# Patient Record
Sex: Male | Born: 1955 | Race: White | Hispanic: No | Marital: Married | Smoking: Never smoker
Health system: Southern US, Community
[De-identification: ages and names within clinical notes are randomized; demographics above are authoritative.]

## PROBLEM LIST (undated history)

## (undated) DIAGNOSIS — J45909 Unspecified asthma, uncomplicated: Secondary | ICD-10-CM

## (undated) DIAGNOSIS — E119 Type 2 diabetes mellitus without complications: Secondary | ICD-10-CM

## (undated) DIAGNOSIS — E291 Testicular hypofunction: Secondary | ICD-10-CM

## (undated) DIAGNOSIS — R7989 Other specified abnormal findings of blood chemistry: Secondary | ICD-10-CM

## (undated) DIAGNOSIS — I10 Essential (primary) hypertension: Secondary | ICD-10-CM

## (undated) DIAGNOSIS — E669 Obesity, unspecified: Secondary | ICD-10-CM

## (undated) DIAGNOSIS — J310 Chronic rhinitis: Secondary | ICD-10-CM

## (undated) DIAGNOSIS — K760 Fatty (change of) liver, not elsewhere classified: Secondary | ICD-10-CM

## (undated) DIAGNOSIS — K635 Polyp of colon: Secondary | ICD-10-CM

## (undated) DIAGNOSIS — R945 Abnormal results of liver function studies: Secondary | ICD-10-CM

## (undated) HISTORY — DX: Unspecified asthma, uncomplicated: J45.909

## (undated) HISTORY — DX: Testicular hypofunction: E29.1

## (undated) HISTORY — DX: Chronic rhinitis: J31.0

## (undated) HISTORY — DX: Type 2 diabetes mellitus without complications: E11.9

## (undated) HISTORY — DX: Obesity, unspecified: E66.9

## (undated) HISTORY — DX: Essential (primary) hypertension: I10

## (undated) HISTORY — DX: Abnormal results of liver function studies: R94.5

## (undated) HISTORY — PX: CARDIAC CATHETERIZATION: SHX172

## (undated) HISTORY — DX: Fatty (change of) liver, not elsewhere classified: K76.0

## (undated) HISTORY — DX: Other specified abnormal findings of blood chemistry: R79.89

## (undated) HISTORY — PX: CORONARY ANGIOPLASTY: SHX604

## (undated) HISTORY — DX: Polyp of colon: K63.5

## (undated) HISTORY — PX: UMBILICAL HERNIA REPAIR: SHX196

## (undated) HISTORY — PX: CAROTID STENT: SHX1301

---

## 2000-01-02 ENCOUNTER — Encounter: Admission: RE | Admit: 2000-01-02 | Discharge: 2000-01-02 | Payer: Self-pay | Admitting: *Deleted

## 2000-01-02 ENCOUNTER — Encounter: Payer: Self-pay | Admitting: *Deleted

## 2000-05-19 ENCOUNTER — Encounter: Admission: RE | Admit: 2000-05-19 | Discharge: 2000-05-19 | Payer: Self-pay | Admitting: *Deleted

## 2000-05-19 ENCOUNTER — Encounter: Payer: Self-pay | Admitting: *Deleted

## 2000-12-21 ENCOUNTER — Ambulatory Visit (HOSPITAL_COMMUNITY): Admission: RE | Admit: 2000-12-21 | Discharge: 2000-12-21 | Payer: Self-pay | Admitting: Gastroenterology

## 2002-12-14 ENCOUNTER — Encounter: Admission: RE | Admit: 2002-12-14 | Discharge: 2003-03-14 | Payer: Self-pay | Admitting: Internal Medicine

## 2010-11-04 ENCOUNTER — Inpatient Hospital Stay (HOSPITAL_COMMUNITY)
Admission: AD | Admit: 2010-11-04 | Discharge: 2010-11-06 | Payer: Self-pay | Source: Home / Self Care | Attending: Cardiology | Admitting: Cardiology

## 2010-11-06 LAB — CBC
HCT: 43.4 % (ref 39.0–52.0)
HCT: 41 % (ref 39.0–52.0)
Hemoglobin: 14.5 g/dL (ref 13.0–17.0)
Hemoglobin: 13.7 g/dL (ref 13.0–17.0)
MCH: 29.6 pg (ref 26.0–34.0)
MCH: 29.8 pg (ref 26.0–34.0)
MCHC: 33.4 g/dL (ref 30.0–36.0)
MCHC: 33.4 g/dL (ref 30.0–36.0)
MCV: 88.6 fL (ref 78.0–100.0)
MCV: 89.3 fL (ref 78.0–100.0)
Platelets: 237 10*3/uL (ref 150–400)
Platelets: 217 10*3/uL (ref 150–400)
RBC: 4.9 MIL/uL (ref 4.22–5.81)
RBC: 4.59 MIL/uL (ref 4.22–5.81)
RDW: 13.1 % (ref 11.5–15.5)
RDW: 13.2 % (ref 11.5–15.5)
WBC: 7.5 10*3/uL (ref 4.0–10.5)
WBC: 7.2 10*3/uL (ref 4.0–10.5)

## 2010-11-06 LAB — LIPID PANEL
Cholesterol: 185 mg/dL (ref 0–200)
HDL: 37 mg/dL — ABNORMAL LOW (ref >39)
LDL Cholesterol: 125 mg/dL — ABNORMAL HIGH (ref 0–99)
Total CHOL/HDL Ratio: 5 RATIO
Triglycerides: 115 mg/dL (ref <150)
VLDL: 23 mg/dL (ref 0–40)

## 2010-11-06 LAB — GLUCOSE, CAPILLARY
Glucose-Capillary: 169 mg/dL — ABNORMAL HIGH (ref 70–99)
Glucose-Capillary: 216 mg/dL — ABNORMAL HIGH (ref 70–99)
Glucose-Capillary: 183 mg/dL — ABNORMAL HIGH (ref 70–99)
Glucose-Capillary: 220 mg/dL — ABNORMAL HIGH (ref 70–99)
Glucose-Capillary: 252 mg/dL — ABNORMAL HIGH (ref 70–99)
Glucose-Capillary: 187 mg/dL — ABNORMAL HIGH (ref 70–99)
Glucose-Capillary: 129 mg/dL — ABNORMAL HIGH (ref 70–99)

## 2010-11-06 LAB — CARDIAC PANEL(CRET KIN+CKTOT+MB+TROPI)
CK, MB: 2.2 ng/mL (ref 0.3–4.0)
CK, MB: 1.8 ng/mL (ref 0.3–4.0)
CK, MB: 1.6 ng/mL (ref 0.3–4.0)
Relative Index: 1.2 (ref 0.0–2.5)
Relative Index: 1.1 (ref 0.0–2.5)
Relative Index: 1.1 (ref 0.0–2.5)
Total CK: 184 U/L (ref 7–232)
Total CK: 157 U/L (ref 7–232)
Total CK: 142 U/L (ref 7–232)
Troponin I: <0.01 ng/mL (ref 0.00–0.06)
Troponin I: 0.01 ng/mL (ref 0.00–0.06)
Troponin I: 0.01 ng/mL (ref 0.00–0.06)

## 2010-11-06 LAB — BASIC METABOLIC PANEL
BUN: 13 mg/dL (ref 6–23)
CO2: 21 mEq/L (ref 19–32)
Calcium: 9.3 mg/dL (ref 8.4–10.5)
Chloride: 109 mEq/L (ref 96–112)
Creatinine, Ser: 1.27 mg/dL (ref 0.4–1.5)
GFR calc Af Amer: >60 mL/min (ref >60)
GFR calc non Af Amer: 59 mL/min — ABNORMAL LOW (ref >60)
Glucose, Bld: 180 mg/dL — ABNORMAL HIGH (ref 70–99)
Potassium: 4.8 mEq/L (ref 3.5–5.1)
Sodium: 143 mEq/L (ref 135–145)

## 2010-11-06 LAB — HEMOGLOBIN A1C
Hgb A1c MFr Bld: 8.3 % — ABNORMAL HIGH (ref <5.7)
Mean Plasma Glucose: 192 mg/dL — ABNORMAL HIGH (ref <117)

## 2010-11-06 LAB — HEPARIN LEVEL (UNFRACTIONATED): Heparin Unfractionated: <0.1 IU/mL — ABNORMAL LOW (ref 0.30–0.70)

## 2010-11-06 LAB — PROTIME-INR
INR: 0.93 (ref 0.00–1.49)
Prothrombin Time: 12.7 seconds (ref 11.6–15.2)

## 2010-11-06 LAB — APTT: aPTT: 25 seconds (ref 24–37)

## 2010-11-06 LAB — TSH: TSH: 1.956 u[IU]/mL (ref 0.350–4.500)

## 2010-11-08 NOTE — Discharge Summary (Signed)
[name], [place]STON NO.:  [id number]  MEDICAL RECORD NO.:  [id number]          PATIENT TYPE:  INP  LOCATION:  6524                         FACILITY:  [facility]  PHYSICIAN:  [name], MD      DATE OF BIRTH:  [birth year]  DATE OF ADMISSION:  11/04/2010 DATE OF DISCHARGE:                              DISCHARGE SUMMARY   PRIMARY CARE PHYSICIAN:  Georgann Housekeeper, MD.  CARDIOLOGIST:  Veverly Fells. Anne Fu, MD.  FINAL DIAGNOSES: 1. Unstable angina - successful stent placement to obtuse marginal     PROMUS 3.0 x 20-mm drug-eluting stent.  Residual small caliber     vessel coronary artery disease in the ramus branch of up to 80% as     well as distal RCA of up to 80%.  Normal left ventricular ejection     fraction by LV gram.  Procedure was cardiac catheterization via the     right radial artery approach. 2. Diabetes.  Hemoglobin A1c 8.3.  Goal was 7, managed by Dr. Georgann Housekeeper on both Amaryl as well as metformin. 3. Hyperlipidemia - total cholesterol 185, HDL 37, LDL 125 with a goal     of less than 70, triglycerides of 115.  He has had multiple statin     intolerances in the past.  According to his records, Crestor,     WelChol, and Zocor all have caused muscle aches.  He is currently     on Pravachol, which he seems to be tolerating.  We will work on     this as an outpatient. 4. Obesity - BMI is 39 - encourage diet and weight loss exercise.     This will also help with his diabetes. 5. Mild fatty liver disease. 6. Hypertension - well controlled throughout hospitalization. 7. Microalbuminemia,  urea amine.  BRIEF HOSPITAL COURSE:  A 55 year old male who was seen in the clinic setting on day of admission for clinical history of unstable angina with chest tightness with exertion, pressure when chopping wood or when exerting himself that was relieved with rest.  He had deeply inverted T- waves in the lateral leads and he was admitted to the hospital for further  observation.  Cardiac biomarkers were all normal.  He is placed on IV heparin.  The next morning, he was cathed via the right radial artery approach, which demonstrated disease as above.  Successful stent placement.  He tolerated the procedure well.  Radial artery is patent.  PHYSICAL EXAMINATION:  VITAL SIGNS:  Blood pressure 134/76, satting 97% on room air, pulse is 75, temperature 97.7, and respiration rate 16. HEART:  Regular rate and rhythm. LUNGS:  Clear.  No JVD.  No significant edema.  DISCHARGE MEDICATIONS:  New medication is clopidogrel or Plavix 75 mg once a day as well as higher dose aspirin 325 mg once a day.  He is also on nitroglycerin sublingual 0.4 mg p.r.n. chest pain.  Other medications are the same and as follows, 1. Atenolol 50 mg once a day. 2. Glimepiride or Amaryl 2 mg twice a day. 3. Metformin XR 500 mg 2 tablets twice  a day. 4. Pravachol or pravastatin 20 mg once a day. 5. Ramipril 2.5 mg once a day (microalbuminuria).  DISCHARGE INSTRUCTIONS:  Followup is on Wednesday, November 13, 2010, at 2:30 p.m. with Dr. Anne Fu.  I also encouraged him to follow up with his primary care physician, Dr. Donette Larry for further intensive management of his diabetes.  Cardiac rehab has also seen him in consultation.  He was ambulating well on day of discharge, feeling well, and quite appreciative.     [name], MD     MCS/MEDQ  D:  11/06/2010  T:  11/06/2010  Job:  [id number]  cc:   Georgann Housekeeper, MD  Electronically Signed by Donato Schultz MD on 11/08/2010 07:04:30 AM

## 2010-11-08 NOTE — Procedures (Signed)
NAMEMERWIN, BREDEN NO.:  [id number]  MEDICAL RECORD NO.:  [id number]          PATIENT TYPE:  INP  LOCATION:  2027                         FACILITY:  [facility]  PHYSICIAN:  [name], MD      DATE OF BIRTH:  1956-10-05  DATE OF PROCEDURE: DATE OF DISCHARGE:                           CARDIAC CATHETERIZATION   PROCEDURE:  Left heart catheterization, selective coronary angiography, and left ventriculogram via the right radial artery approach.  INDICATIONS:  A 55 year old male with diabetes, hypertension, hyperlipidemia, and obesity who over the past few months has been experiencing accelerating angina and burning in his mid chest wall with activity that goes away with rest and presented to the [facility] yesterday with an EKG that showed deeply inverted T-waves in V4, V5, and V6 as well as T-wave inversions in I, AVL, and biphasic T-wave in V3 consistent with ischemia.  He was admitted from my office after consultation and here for cardiac catheterization.  Informed consent was obtained.  Risk of stroke, heart attack, death, renal impairment, and bleeding have been explained to the patient.  Creatinine on morning of procedure is 1.2.  PROCEDURE DETAILS:  Lidocaine 1% was used for local anesthesia. Fentanyl and Versed were used for conscious sedation.  A 5-French hydrophilic sheath was inserted without difficulty into the right radial artery.  A wooly wire was used to traverse a Judkins right #4 catheter around the arch of the aorta into the right coronary artery.  After the arch was traversed, 4000 units of heparin was administered, 3 mg of verapamil was also administered.  A Judkins right #4 catheter was used to selectively cannulate the right coronary artery and multiple views with hand injection of Omnipaque were obtained.  This right coronary artery also easily went into the left ventricle and left ventriculogram in the RAO  position utilizing hand injection was performed.  This catheter was then exchanged for a Judkins left 3.5 catheter, which selectively cannulated the left main artery.  Multiple views with hand injection of Omnipaque were obtained.  Following the procedure, the 5- French hydrophilic sheath was taken out and a 6-French sheath was inserted in its place.  Verapamil 2 mg was administered as well as 200 mcg of nitroglycerin into the sheath during sheath exchange.  The patient was quite comfortable, feeling well.  FINDINGS: 1. Left main artery - widely patent, gives rise to the LAD, small     ramus branch, and circumflex artery. 2. LAD artery - minor luminal irregularities throughout the LAD with     no significant flow-limiting disease present.  There is one main     long diagonal branch originating at the branch point of the septal     branch as well.  No ostial disease. 3. Small ramus branch - there is a mid lesion of approximately 70-80%,     however, this vessel was quite small in character. 4. Circumflex artery - this vessel gives rise to two obtuse marginal     branches.  The first obtuse marginal branch just after the branch     point from the AV groove circ demonstrates  approximately 15 mm in     length tubular stenosis of 90%.  The vessel then branches just     after this stenosis.  Minor luminal irregularities throughout     otherwise. 5. Right coronary artery - this vessel is a dominant vessel giving     rise to the posterior descending artery, and there is a small 80%     lesion in the distal RCA, which is quite a small vessel, almost the     same size of that small ramus branch previously described.  Left ventriculogram:  Left ventricular ejection fraction appears normal with no wall motion abnormalities present in the RAO position.  This was done via hand injection and post PVC beat is best visualized.  An echocardiogram can be performed later.  IMPRESSION: 1. An 80% obtuse  marginal disease consistent with ischemia on EKG.     There is also a small ramus branch, possibly a 2-mm vessel with mid     80% stenosis as well as a distal right coronary artery small 2-mm     branch of 70-80%. 2. Normal-appearing left ventricular ejection fraction with no     evidence of any aortic stenosis or mitral regurgitation.     Hemodynamics are as follows:  Left ventricle 126 with an end-     diastolic pressure of 18 mmHg.  Aortic pressure 126/78 with a mean     of 100 mmHg.  PLAN:  Findings have been discussed with the patient as well as Dr. Eldridge Dace, and we will focus on the obtuse marginal lesion in this large vessel, which is correlating with ischemia seen on EKG.  At this point, no intervention on right coronary artery or small ramus branch given their small-caliber vessel nature.     [name], MD     MCS/MEDQ  D:  11/05/2010  T:  11/05/2010  Job:  [id number]  cc:   Georgann Housekeeper, MD  Electronically Signed by Donato Schultz MD on 11/08/2010 07:04:20 AM

## 2010-11-08 NOTE — H&P (Signed)
[name], [name] NO.:  [id number]  MEDICAL RECORD NO.:  [id number]          PATIENT TYPE:  INP  LOCATION:  2027                         FACILITY:  [facility]  PHYSICIAN:  [name], MD      DATE OF BIRTH:  Feb 18, 1956  DATE OF ADMISSION:  11/04/2010 DATE OF DISCHARGE:                             HISTORY & PHYSICAL   PRIMARY CARE PHYSICIAN:  Georgann Housekeeper, MD  REASON FOR ADMISSION:  Unstable angina.  HISTORY OF PRESENT ILLNESS:  A 55 year old male with diabetes, hypertension, hyperlipidemia, obesity who has been experiencing increasing chest tightness/burning with exertion that is relieved with rest, moderate severity.  He states that when going up stairs or chopping wood, he experiences this discomfort and when he sits down it goes away.  He thought at first that it was related to his recent upper respiratory infection that he had approximately a month ago but it has not subsided.  An EKG was performed downstairs at his initial encounter with [facility], which demonstrated sinus bradycardia rate 54 with biphasic T- wave in lead V3 and deeply inverted T-waves in V4, V5, V6 as well as one in AVL.  He also had an inverted T-wave in lead II and aVF.  This was a marked change from his prior EKG in 2001 which was normal.  When asked if he had any discomfort today, he did state that point right before lunch when moving around, he did experience some burning which was relieved with rest.  He had also lost approximately 20 pounds earlier last year with exercise and he had not been experiencing these symptoms.  PAST MEDICAL HISTORY:  Hypertension, dyslipidemia, diabetes, mild fatty liver disease with mildly elevated LFT workup in 2001, obesity, colonic polyp in March 2008, testosterone replacement, chronic kidney disease stage I, with microalbumin.  ALLERGIES/INTOLERANCES:  [employer], and ZOCOR; all causes muscle aches/GI upset.  SURGICAL  HISTORY:  Umbilical hernia repair in 1997.  FAMILY HISTORY:  Father deceased in his early 46s from heart disease. He also had colon cancer and diabetes.  Mother is alive and well at age 45.  He has four brothers and two sisters.  SOCIAL HISTORY:  Never smoked.  No drinking.  No alcohol.  Does drink caffeine.  No recreational drug use.  He works with [employer].  He is one of the manager's.  He is married with two children.  REVIEW OF SYSTEMS:  Denies any bleeding problems, melena, syncope, orthopnea.  Unless specified above, all other 12 review of systems negative.  PHYSICAL EXAM:  VITAL SIGNS:  Weight 262 pounds, BMI 39.8, pulse 64, blood pressure 152/96. GENERAL:  Alert and oriented x3 in no acute distress, ruddy skin appearance. EYES:  Well-perfused conjunctivae.  EOMI.  No scleral icterus. NECK:  Thick, supple.  No lymphadenopathy.  No thyromegaly.  No carotid bruits.  No JVD. CARDIOVASCULAR:  Regular rate and rhythm/bradycardic, regular rhythm with no appreciable murmurs, rubs, or gallops.  Normal PMI. LUNGS:  Clear to auscultation bilaterally.  Normal respiratory effort. ABDOMEN:  Soft, nontender.  Normoactive bowel sounds.  Obese.  Positive bruits. EXTREMITIES:  No clubbing, cyanosis, or edema.  Normal distal pulses, 2+ radial pulses bilaterally. GU:  Deferred. RECTAL:  Deferred. NEUROLOGIC:  Nonfocal.  No tremors are noted. SKIN:  Warm, dry, and intact, ruddy skin appearance.  LABS:  Full complement of lab work including cardiac biomarkers are currently pending.  Most recent creatinine in August showed was 1.08. Most recent hemoglobin A1c was 7.4.  EKG as described above.  ASSESSMENT/PLAN:  A 55 year old male with diabetes, hypertension, hyperlipidemia, obesity with unstable angina. 1. Unstable angina - had chest discomfort earlier today, burning     relieved with rest that has been increasing in frequency and     severity and is accompanied with  an EKG highly concerning for     ischemia given his deep T-wave inversions in the precordial leads     as described above.  I did not feel comfortable letting him go home     and we will be admitting him to the hospital to telemetry and will     place him on heparin, continue beta-blocker, atenolol, and     Pravachol and ramipril for blood pressure.  We will go ahead and     set him up for cardiac catheterization tomorrow morning to further     illustrate his vessels.  We reviewed the risks and benefits of the     procedure including stroke, heart attack, death, renal impairment.     He understands the possibility of possible percutaneous     intervention and/or bypass surgery.  We will also continue with     full complement of blood work including cardiac markers, INR, CBC,     TSH, and a 12-lead in the morning as well as fasting lipids in the     morning.  I will hold off on Plavix for now given the possibility of bypass surgery.  We will promptly initiate heparin IV.  1. Diabetes - we will place on insulin sliding scale.  We will hold     his metformin as well as Amaryl. 2. Hyperlipidemia - we will continue with Pravachol for now, likely     goal LDL less than 70.  He has had intolerances to Crestor,     WelChol, and Zocor. 3. Obesity - encourage weight loss.     [name], MD     MCS/MEDQ  D:  11/04/2010  T:  11/05/2010  Job:  [id number]  cc:   Georgann Housekeeper, MD  Electronically Signed by Donato Schultz MD on 11/08/2010 07:04:04 AM

## 2010-11-11 LAB — BASIC METABOLIC PANEL
BUN: 11 mg/dL (ref 6–23)
CO2: 26 mEq/L (ref 19–32)
Calcium: 9.2 mg/dL (ref 8.4–10.5)
Chloride: 105 mEq/L (ref 96–112)
Creatinine, Ser: 1.2 mg/dL (ref 0.4–1.5)
GFR calc Af Amer: >60 mL/min (ref >60)
GFR calc non Af Amer: >60 mL/min (ref >60)
Glucose, Bld: 189 mg/dL — ABNORMAL HIGH (ref 70–99)
Potassium: 4.6 mEq/L (ref 3.5–5.1)
Sodium: 141 mEq/L (ref 135–145)

## 2010-11-11 LAB — GLUCOSE, CAPILLARY: Glucose-Capillary: 183 mg/dL — ABNORMAL HIGH (ref 70–99)

## 2010-11-11 LAB — CBC
HCT: 42.4 % (ref 39.0–52.0)
Hemoglobin: 14 g/dL (ref 13.0–17.0)
MCH: 29.5 pg (ref 26.0–34.0)
MCHC: 33 g/dL (ref 30.0–36.0)
MCV: 89.3 fL (ref 78.0–100.0)
Platelets: 201 10*3/uL (ref 150–400)
RBC: 4.75 MIL/uL (ref 4.22–5.81)
RDW: 13.2 % (ref 11.5–15.5)
WBC: 8.2 10*3/uL (ref 4.0–10.5)

## 2010-11-11 LAB — HEPARIN LEVEL (UNFRACTIONATED): Heparin Unfractionated: <0.1 IU/mL — ABNORMAL LOW (ref 0.30–0.70)

## 2010-11-23 ENCOUNTER — Emergency Department (HOSPITAL_COMMUNITY): Payer: BC Managed Care – PPO

## 2010-11-23 ENCOUNTER — Emergency Department (HOSPITAL_COMMUNITY)
Admission: EM | Admit: 2010-11-23 | Discharge: 2010-11-23 | Disposition: A | Discharge status: Home / Self Care | Payer: BC Managed Care – PPO | Attending: Emergency Medicine | Admitting: Emergency Medicine

## 2010-11-23 DIAGNOSIS — I10 Essential (primary) hypertension: Secondary | ICD-10-CM | POA: Insufficient documentation

## 2010-11-23 DIAGNOSIS — I2510 Atherosclerotic heart disease of native coronary artery without angina pectoris: Secondary | ICD-10-CM | POA: Insufficient documentation

## 2010-11-23 DIAGNOSIS — L299 Pruritus, unspecified: Secondary | ICD-10-CM | POA: Insufficient documentation

## 2010-11-23 DIAGNOSIS — E119 Type 2 diabetes mellitus without complications: Secondary | ICD-10-CM | POA: Insufficient documentation

## 2010-11-23 DIAGNOSIS — M7989 Other specified soft tissue disorders: Secondary | ICD-10-CM | POA: Insufficient documentation

## 2010-11-23 LAB — COMPREHENSIVE METABOLIC PANEL
Albumin: 3.9 g/dL (ref 3.5–5.2)
Alkaline Phosphatase: 58 U/L (ref 39–117)
BUN: 13 mg/dL (ref 6–23)
CO2: 24 mEq/L (ref 19–32)
Chloride: 104 mEq/L (ref 96–112)
GFR calc non Af Amer: >60 mL/min (ref >60)
Glucose, Bld: 177 mg/dL — ABNORMAL HIGH (ref 70–99)
Potassium: 4.6 mEq/L (ref 3.5–5.1)
Total Bilirubin: 0.6 mg/dL (ref 0.3–1.2)

## 2010-11-23 LAB — CBC
Hemoglobin: 14.5 g/dL (ref 13.0–17.0)
MCH: 30.4 pg (ref 26.0–34.0)
MCHC: 34.4 g/dL (ref 30.0–36.0)
MCV: 88.3 fL (ref 78.0–100.0)
RBC: 4.77 MIL/uL (ref 4.22–5.81)

## 2010-11-23 LAB — DIFFERENTIAL
Eosinophils Absolute: 0.2 10*3/uL (ref 0.0–0.7)
Lymphs Abs: 1 10*3/uL (ref 0.7–4.0)
Monocytes Absolute: 0.4 10*3/uL (ref 0.1–1.0)
Monocytes Relative: 5 % (ref 3–12)
Neutro Abs: 6.6 10*3/uL (ref 1.7–7.7)
Neutrophils Relative %: 80 % — ABNORMAL HIGH (ref 43–77)

## 2010-11-23 LAB — SEDIMENTATION RATE: Sed Rate: 20 mm/hr — ABNORMAL HIGH (ref 0–16)

## 2010-11-23 LAB — URINALYSIS, ROUTINE W REFLEX MICROSCOPIC
Bilirubin Urine: NEGATIVE
Hgb urine dipstick: NEGATIVE
Ketones, ur: NEGATIVE mg/dL
Urine Glucose, Fasting: NEGATIVE mg/dL
pH: 7 (ref 5.0–8.0)

## 2010-11-25 ENCOUNTER — Ambulatory Visit (HOSPITAL_COMMUNITY): Payer: BC Managed Care – PPO

## 2010-11-27 ENCOUNTER — Encounter (HOSPITAL_COMMUNITY): Payer: BC Managed Care – PPO

## 2010-11-29 ENCOUNTER — Encounter (HOSPITAL_COMMUNITY): Payer: BC Managed Care – PPO

## 2010-12-02 ENCOUNTER — Encounter (HOSPITAL_COMMUNITY): Payer: BC Managed Care – PPO

## 2010-12-04 ENCOUNTER — Encounter (HOSPITAL_COMMUNITY): Payer: BC Managed Care – PPO

## 2010-12-04 NOTE — Procedures (Signed)
  [name], CRIST NO.:  [id number]  MEDICAL RECORD NO.:  [id number]          PATIENT TYPE:  INP  LOCATION:  2807                         FACILITY:  [facility]  PHYSICIAN:  Corky Crafts, MDDATE OF BIRTH:  03/05/1956  DATE OF PROCEDURE:  11/05/2010 DATE OF DISCHARGE:                           CARDIAC CATHETERIZATION   PRIMARY CARDIOLOGIST:  [name], MD  PRIMARY CARE PHYSICIAN:  Georgann Housekeeper, MD  PROCEDURES PERFORMED:  PCI of the first obtuse marginal.  OPERATOR:  Corky Crafts, MD  INDICATIONS:  Abnormal stress test and angina.  PROCEDURE:  The diagnostic catheterization was done by Dr. Anne Fu.  This revealed a 90% OM1 lesion.  Angiomax was given.  A 6-French radial sheath was used.  A 3.5 JL guide was used to engage the left main.  A Prowater wire was placed across the lesion.  A 2.5 x 15 apex balloon was inflated at 8 and 10 atmospheres for predilatation.  A 3.0 x 20 Promus element stent was then placed across the diseased area and deployed 14 atmospheres.  A 3.25 x 15 [place] Quantum apex balloon was inflated for 16 atmospheres and then again to 12 atmospheres.  There was an excellent angiographic result with no residual stenosis.  TIMI3 flow was maintained throughout.  IMPRESSION:  Successful PCI of the OM1 with drug-eluting stent.  RECOMMENDATIONS:  Continue aspirin and Plavix for at least a year.  The patient will be watched overnight.  Nitroglycerin was given into the coronary system to help with vasospasm.  It was also given through the radial sheath to help with radial artery vasospasm.     Corky Crafts, MD     JSV/MEDQ  D:  11/05/2010  T:  11/05/2010  Job:  [id number]  Electronically Signed by Lance Muss MD on 12/04/2010 09:35:29 AM

## 2010-12-06 ENCOUNTER — Encounter (HOSPITAL_COMMUNITY): Payer: BC Managed Care – PPO

## 2010-12-09 ENCOUNTER — Encounter (HOSPITAL_COMMUNITY): Payer: BC Managed Care – PPO

## 2010-12-11 ENCOUNTER — Encounter (HOSPITAL_COMMUNITY): Payer: BC Managed Care – PPO

## 2010-12-13 ENCOUNTER — Encounter (HOSPITAL_COMMUNITY): Payer: BC Managed Care – PPO

## 2010-12-16 ENCOUNTER — Other Ambulatory Visit: Payer: Self-pay

## 2010-12-16 ENCOUNTER — Encounter (HOSPITAL_COMMUNITY)
Admission: RE | Admit: 2010-12-16 | Discharge: 2010-12-16 | Disposition: A | Discharge status: Home / Self Care | Payer: BC Managed Care – PPO | Source: Ambulatory Visit | Attending: Cardiology | Admitting: Cardiology

## 2010-12-16 DIAGNOSIS — I200 Unstable angina: Secondary | ICD-10-CM | POA: Insufficient documentation

## 2010-12-16 DIAGNOSIS — E119 Type 2 diabetes mellitus without complications: Secondary | ICD-10-CM | POA: Insufficient documentation

## 2010-12-16 DIAGNOSIS — Z5189 Encounter for other specified aftercare: Secondary | ICD-10-CM | POA: Insufficient documentation

## 2010-12-16 DIAGNOSIS — I2510 Atherosclerotic heart disease of native coronary artery without angina pectoris: Secondary | ICD-10-CM | POA: Insufficient documentation

## 2010-12-16 DIAGNOSIS — Z9861 Coronary angioplasty status: Secondary | ICD-10-CM | POA: Insufficient documentation

## 2010-12-16 DIAGNOSIS — E785 Hyperlipidemia, unspecified: Secondary | ICD-10-CM | POA: Insufficient documentation

## 2010-12-16 DIAGNOSIS — E669 Obesity, unspecified: Secondary | ICD-10-CM | POA: Insufficient documentation

## 2010-12-16 DIAGNOSIS — I10 Essential (primary) hypertension: Secondary | ICD-10-CM | POA: Insufficient documentation

## 2010-12-16 DIAGNOSIS — K7689 Other specified diseases of liver: Secondary | ICD-10-CM | POA: Insufficient documentation

## 2010-12-16 LAB — GLUCOSE, CAPILLARY: Glucose-Capillary: 172 mg/dL — ABNORMAL HIGH (ref 70–99)

## 2010-12-18 ENCOUNTER — Other Ambulatory Visit: Payer: Self-pay

## 2010-12-18 ENCOUNTER — Encounter (HOSPITAL_COMMUNITY): Payer: BC Managed Care – PPO

## 2010-12-20 ENCOUNTER — Encounter (HOSPITAL_COMMUNITY): Payer: BC Managed Care – PPO

## 2010-12-23 ENCOUNTER — Encounter (HOSPITAL_COMMUNITY): Payer: BC Managed Care – PPO

## 2010-12-24 ENCOUNTER — Observation Stay (HOSPITAL_COMMUNITY)
Admission: RE | Admit: 2010-12-24 | Discharge: 2010-12-24 | Disposition: A | Discharge status: Home / Self Care | Payer: BC Managed Care – PPO | Source: Ambulatory Visit | Attending: Cardiology | Admitting: Cardiology

## 2010-12-24 DIAGNOSIS — E119 Type 2 diabetes mellitus without complications: Secondary | ICD-10-CM | POA: Insufficient documentation

## 2010-12-24 DIAGNOSIS — I2510 Atherosclerotic heart disease of native coronary artery without angina pectoris: Principal | ICD-10-CM | POA: Insufficient documentation

## 2010-12-24 DIAGNOSIS — I10 Essential (primary) hypertension: Secondary | ICD-10-CM | POA: Insufficient documentation

## 2010-12-24 DIAGNOSIS — R079 Chest pain, unspecified: Secondary | ICD-10-CM | POA: Insufficient documentation

## 2010-12-24 DIAGNOSIS — E785 Hyperlipidemia, unspecified: Secondary | ICD-10-CM | POA: Insufficient documentation

## 2010-12-24 DIAGNOSIS — R9431 Abnormal electrocardiogram [ECG] [EKG]: Secondary | ICD-10-CM | POA: Insufficient documentation

## 2010-12-24 DIAGNOSIS — Z9861 Coronary angioplasty status: Secondary | ICD-10-CM | POA: Insufficient documentation

## 2010-12-24 LAB — GLUCOSE, CAPILLARY: Glucose-Capillary: 154 mg/dL — ABNORMAL HIGH (ref 70–99)

## 2010-12-25 ENCOUNTER — Encounter (HOSPITAL_COMMUNITY): Payer: BC Managed Care – PPO

## 2010-12-27 ENCOUNTER — Encounter (HOSPITAL_COMMUNITY): Payer: BC Managed Care – PPO

## 2010-12-30 ENCOUNTER — Encounter (HOSPITAL_COMMUNITY): Payer: BC Managed Care – PPO | Attending: Cardiology

## 2010-12-30 DIAGNOSIS — K7689 Other specified diseases of liver: Secondary | ICD-10-CM | POA: Insufficient documentation

## 2010-12-30 DIAGNOSIS — Z9861 Coronary angioplasty status: Secondary | ICD-10-CM | POA: Insufficient documentation

## 2010-12-30 DIAGNOSIS — I200 Unstable angina: Secondary | ICD-10-CM | POA: Insufficient documentation

## 2010-12-30 DIAGNOSIS — E119 Type 2 diabetes mellitus without complications: Secondary | ICD-10-CM | POA: Insufficient documentation

## 2010-12-30 DIAGNOSIS — Z5189 Encounter for other specified aftercare: Secondary | ICD-10-CM | POA: Insufficient documentation

## 2010-12-30 DIAGNOSIS — E669 Obesity, unspecified: Secondary | ICD-10-CM | POA: Insufficient documentation

## 2010-12-30 DIAGNOSIS — E785 Hyperlipidemia, unspecified: Secondary | ICD-10-CM | POA: Insufficient documentation

## 2010-12-30 DIAGNOSIS — I10 Essential (primary) hypertension: Secondary | ICD-10-CM | POA: Insufficient documentation

## 2010-12-30 DIAGNOSIS — I2510 Atherosclerotic heart disease of native coronary artery without angina pectoris: Secondary | ICD-10-CM | POA: Insufficient documentation

## 2011-01-01 ENCOUNTER — Encounter (HOSPITAL_COMMUNITY): Payer: BC Managed Care – PPO

## 2011-01-03 ENCOUNTER — Encounter (HOSPITAL_COMMUNITY): Payer: BC Managed Care – PPO

## 2011-01-06 ENCOUNTER — Other Ambulatory Visit: Payer: Self-pay

## 2011-01-06 ENCOUNTER — Encounter (HOSPITAL_COMMUNITY): Payer: BC Managed Care – PPO

## 2011-01-08 ENCOUNTER — Encounter (HOSPITAL_COMMUNITY): Payer: BC Managed Care – PPO

## 2011-01-10 ENCOUNTER — Encounter (HOSPITAL_COMMUNITY): Payer: BC Managed Care – PPO

## 2011-01-13 ENCOUNTER — Encounter (HOSPITAL_COMMUNITY): Payer: BC Managed Care – PPO

## 2011-01-13 NOTE — Procedures (Signed)
NAMEROWYN, SPILDE NO.:  [id number]  MEDICAL RECORD NO.:  [id number]           PATIENT TYPE:  I  LOCATION:  6522                         FACILITY:  [facility]  PHYSICIAN:  [name], MD      DATE OF BIRTH:  06-Dec-1955  DATE OF PROCEDURE:  12/24/2010 DATE OF DISCHARGE:  12/24/2010                           CARDIAC CATHETERIZATION   INDICATIONS:  Mr. Ketchum is a 55 year old male, who recently underwent drug-eluting stent to the obtuse marginal branch on November 05, 2010, who during cardiac rehab during relaxation noticed increased ST-segment depression and deep T-wave inversion especially in the lateral leads on the monitor.  He states that he was not having any specific symptoms, but it feels more anxious and noticed occasional mild chest tightness, nonexertional.  He was taken to the cardiac catheterization lab to ensure that the deployment of the circumflex stent was normal.  PROCEDURE IN DETAILS:  Informed consent was obtained.  Risk of stroke, heart attack, death, renal impairment, arterial damage, bleeding were explained to the patient at length.  The right radial artery site was used, 1% lidocaine was used for local anesthesia.  Allen's test was done pre and post procedure and was normal.  A 5-French sheath was inserted in the right radial artery without difficulty utilizing the modified Seldinger technique and a Wholey wire was used to traverse around the aortic arch and a Judkins #4 right catheter and a Judkins #3.5 catheter were used to selectively cannulate the coronary arteries.  A 3 mg of verapamil was administered intrasheath after insertion and 4000 units of heparin was administered once the catheter traverse the aortic arch.  As of note around his aortic arch, deep breathing help to straighten out the arch to allow for improved wire/catheter positioning.  An angled pigtail was then used to cross the left ventricle and the  left ventriculogram utilizing 30 mL of contrast in the LAO position, was performed to better evaluate the anterolateral wall.  Following the procedure, catheters removed, sheath was removed, and Terumo T band was used for hemostasis.  He tolerated the procedure well without any difficulties.  FINDINGS: 1. Left main artery - widely patent, short, divides into the     circumflex artery as well as LAD.  There is a ramus branch present     as well.  No angiographically significant disease in the left main     artery. 2. Circumflex artery - the portion of the stent placement is widely     patent with minimal step-up just following the bifurcation of the     AV groove circ.  Excellent appointment and no evidence of stenosis.     The remainder of the vessel remains widely patent. 3. Ramus branch - this small caliber ramus branch which is     approximately a 1.5-2.0 vessel, does have residual 80%-90% stenosis     in its mid proximal segment.  Certainly, less in caliber size than     the 5-French catheter being utilized for angiography. 4. Left anterior descending artery.  This vessel is widely patent     giving rise to 2 diagonal  branches.  No angiographically     significant disease.  Left ventriculogram performed in the LAO     position shows normal left ventricular ejection fraction with no     evidence of any wall motion abnormality in the lateral projection.  HEMODYNAMICS:  Left ventricular systolic pressure 131 with an end- diastolic pressure of 12.  Aortic pressure 135/76 with a mean of 100 mmHg.  There is no evidence of mitral regurgitation or aortic stenosis present.  IMPRESSION: 1. Widely patent recently placed drug-eluting stent in the left     circumflex distribution. 2. 80%-90% stenosis in a small-caliber ramus branch, which was seen on     previous angiography. 3. Normal left ventricular ejection fraction with no evidence of wall     motion abnormality. 4. No evidence of  mitral regurgitation or aortic stenosis.  PLAN:  After review of the angiogram, we will continue to medically manage the 80%-90% stenosis in the small ramus branch.  Given his lack of symptoms currently, I did not feel that this warrants percutaneous intervention.  Medications reviewed.  We will continue with current therapy which includes isosorbide.  He does note that he has been feeling quite well in cardiac rehab.  We will see him back in clinic in 1 week.     [name], MD     MCS/MEDQ  D:  12/24/2010  T:  12/25/2010  Job:  [id number]  Electronically Signed by Donato Schultz MD on 01/13/2011 02:26:55 PM

## 2011-01-15 ENCOUNTER — Encounter (HOSPITAL_COMMUNITY): Payer: BC Managed Care – PPO

## 2011-01-17 ENCOUNTER — Encounter (HOSPITAL_COMMUNITY): Payer: BC Managed Care – PPO

## 2011-01-20 ENCOUNTER — Encounter (HOSPITAL_COMMUNITY): Payer: BC Managed Care – PPO | Attending: Cardiology

## 2011-01-20 DIAGNOSIS — I2510 Atherosclerotic heart disease of native coronary artery without angina pectoris: Secondary | ICD-10-CM | POA: Insufficient documentation

## 2011-01-20 DIAGNOSIS — E119 Type 2 diabetes mellitus without complications: Secondary | ICD-10-CM | POA: Insufficient documentation

## 2011-01-20 DIAGNOSIS — I10 Essential (primary) hypertension: Secondary | ICD-10-CM | POA: Insufficient documentation

## 2011-01-20 DIAGNOSIS — K7689 Other specified diseases of liver: Secondary | ICD-10-CM | POA: Insufficient documentation

## 2011-01-20 DIAGNOSIS — E785 Hyperlipidemia, unspecified: Secondary | ICD-10-CM | POA: Insufficient documentation

## 2011-01-20 DIAGNOSIS — I200 Unstable angina: Secondary | ICD-10-CM | POA: Insufficient documentation

## 2011-01-20 DIAGNOSIS — Z9861 Coronary angioplasty status: Secondary | ICD-10-CM | POA: Insufficient documentation

## 2011-01-20 DIAGNOSIS — E669 Obesity, unspecified: Secondary | ICD-10-CM | POA: Insufficient documentation

## 2011-01-20 DIAGNOSIS — Z5189 Encounter for other specified aftercare: Secondary | ICD-10-CM | POA: Insufficient documentation

## 2011-01-22 ENCOUNTER — Encounter (HOSPITAL_COMMUNITY): Payer: BC Managed Care – PPO

## 2011-01-24 ENCOUNTER — Encounter (HOSPITAL_COMMUNITY): Payer: BC Managed Care – PPO

## 2011-01-27 ENCOUNTER — Encounter (HOSPITAL_COMMUNITY): Payer: BC Managed Care – PPO

## 2011-01-29 ENCOUNTER — Encounter (HOSPITAL_COMMUNITY): Payer: BC Managed Care – PPO

## 2011-01-31 ENCOUNTER — Encounter (HOSPITAL_COMMUNITY): Payer: BC Managed Care – PPO

## 2011-02-03 ENCOUNTER — Encounter (HOSPITAL_COMMUNITY): Payer: BC Managed Care – PPO

## 2011-02-05 ENCOUNTER — Encounter (HOSPITAL_COMMUNITY): Payer: BC Managed Care – PPO

## 2011-02-07 ENCOUNTER — Encounter (HOSPITAL_COMMUNITY): Payer: BC Managed Care – PPO

## 2011-02-07 ENCOUNTER — Other Ambulatory Visit: Payer: Self-pay | Admitting: Cardiology

## 2011-02-07 LAB — GLUCOSE, CAPILLARY: Glucose-Capillary: 175 mg/dL — ABNORMAL HIGH (ref 70–99)

## 2011-02-10 ENCOUNTER — Encounter (HOSPITAL_COMMUNITY): Payer: BC Managed Care – PPO

## 2011-02-12 ENCOUNTER — Encounter (HOSPITAL_COMMUNITY): Payer: BC Managed Care – PPO

## 2011-02-14 ENCOUNTER — Encounter (HOSPITAL_COMMUNITY): Payer: BC Managed Care – PPO

## 2011-02-17 ENCOUNTER — Encounter (HOSPITAL_COMMUNITY): Payer: BC Managed Care – PPO

## 2011-02-19 ENCOUNTER — Encounter (HOSPITAL_COMMUNITY): Payer: BC Managed Care – PPO | Attending: Cardiology

## 2011-02-19 DIAGNOSIS — E669 Obesity, unspecified: Secondary | ICD-10-CM | POA: Insufficient documentation

## 2011-02-19 DIAGNOSIS — Z5189 Encounter for other specified aftercare: Secondary | ICD-10-CM | POA: Insufficient documentation

## 2011-02-19 DIAGNOSIS — I2510 Atherosclerotic heart disease of native coronary artery without angina pectoris: Secondary | ICD-10-CM | POA: Insufficient documentation

## 2011-02-19 DIAGNOSIS — I200 Unstable angina: Secondary | ICD-10-CM | POA: Insufficient documentation

## 2011-02-19 DIAGNOSIS — K7689 Other specified diseases of liver: Secondary | ICD-10-CM | POA: Insufficient documentation

## 2011-02-19 DIAGNOSIS — Z9861 Coronary angioplasty status: Secondary | ICD-10-CM | POA: Insufficient documentation

## 2011-02-19 DIAGNOSIS — E119 Type 2 diabetes mellitus without complications: Secondary | ICD-10-CM | POA: Insufficient documentation

## 2011-02-19 DIAGNOSIS — E785 Hyperlipidemia, unspecified: Secondary | ICD-10-CM | POA: Insufficient documentation

## 2011-02-19 DIAGNOSIS — I10 Essential (primary) hypertension: Secondary | ICD-10-CM | POA: Insufficient documentation

## 2011-02-21 ENCOUNTER — Encounter (HOSPITAL_COMMUNITY): Payer: BC Managed Care – PPO

## 2011-02-24 ENCOUNTER — Encounter (HOSPITAL_COMMUNITY): Payer: BC Managed Care – PPO

## 2011-02-26 ENCOUNTER — Encounter (HOSPITAL_COMMUNITY): Payer: BC Managed Care – PPO

## 2011-02-28 ENCOUNTER — Encounter (HOSPITAL_COMMUNITY): Payer: BC Managed Care – PPO

## 2011-02-28 ENCOUNTER — Other Ambulatory Visit: Payer: Self-pay | Admitting: Cardiology

## 2011-02-28 LAB — GLUCOSE, CAPILLARY: Glucose-Capillary: 163 mg/dL — ABNORMAL HIGH (ref 70–99)

## 2011-03-03 ENCOUNTER — Encounter (HOSPITAL_COMMUNITY): Payer: BC Managed Care – PPO

## 2011-03-05 ENCOUNTER — Encounter (HOSPITAL_COMMUNITY): Payer: BC Managed Care – PPO

## 2011-03-06 ENCOUNTER — Emergency Department (HOSPITAL_COMMUNITY): Payer: BC Managed Care – PPO

## 2011-03-06 ENCOUNTER — Emergency Department (HOSPITAL_COMMUNITY)
Admission: EM | Admit: 2011-03-06 | Discharge: 2011-03-06 | Disposition: A | Discharge status: Home / Self Care | Payer: BC Managed Care – PPO | Attending: Emergency Medicine | Admitting: Emergency Medicine

## 2011-03-06 DIAGNOSIS — I10 Essential (primary) hypertension: Secondary | ICD-10-CM | POA: Insufficient documentation

## 2011-03-06 DIAGNOSIS — R079 Chest pain, unspecified: Secondary | ICD-10-CM | POA: Insufficient documentation

## 2011-03-06 DIAGNOSIS — E7800 Pure hypercholesterolemia, unspecified: Secondary | ICD-10-CM | POA: Insufficient documentation

## 2011-03-06 DIAGNOSIS — E119 Type 2 diabetes mellitus without complications: Secondary | ICD-10-CM | POA: Insufficient documentation

## 2011-03-06 DIAGNOSIS — Z79899 Other long term (current) drug therapy: Secondary | ICD-10-CM | POA: Insufficient documentation

## 2011-03-06 DIAGNOSIS — Z7982 Long term (current) use of aspirin: Secondary | ICD-10-CM | POA: Insufficient documentation

## 2011-03-06 DIAGNOSIS — R1012 Left upper quadrant pain: Secondary | ICD-10-CM | POA: Insufficient documentation

## 2011-03-06 DIAGNOSIS — I701 Atherosclerosis of renal artery: Secondary | ICD-10-CM | POA: Insufficient documentation

## 2011-03-06 LAB — DIFFERENTIAL
Basophils Absolute: 0.1 10*3/uL (ref 0.0–0.1)
Eosinophils Relative: 8 % — ABNORMAL HIGH (ref 0–5)
Lymphocytes Relative: 32 % (ref 12–46)
Monocytes Absolute: 0.6 10*3/uL (ref 0.1–1.0)

## 2011-03-06 LAB — POCT CARDIAC MARKERS: CKMB, poc: <1 ng/mL — ABNORMAL LOW (ref 1.0–8.0)

## 2011-03-06 LAB — CBC
HCT: 40.5 % (ref 39.0–52.0)
MCHC: 34.1 g/dL (ref 30.0–36.0)
MCV: 89.4 fL (ref 78.0–100.0)
RDW: 13.9 % (ref 11.5–15.5)

## 2011-03-06 LAB — BASIC METABOLIC PANEL
BUN: 12 mg/dL (ref 6–23)
Calcium: 9.2 mg/dL (ref 8.4–10.5)
GFR calc non Af Amer: >60 mL/min (ref >60)
Glucose, Bld: 171 mg/dL — ABNORMAL HIGH (ref 70–99)
Sodium: 138 mEq/L (ref 135–145)

## 2011-03-07 ENCOUNTER — Encounter (HOSPITAL_COMMUNITY): Payer: BC Managed Care – PPO

## 2011-03-07 NOTE — Procedures (Signed)
[place]  Patient:    [name], [name]                  MRN: [id number] Proc. Date: 12/21/00 Attending:  Verlin Grills, M.D. CC:         Yvette Rack. Dorna Bloom, M.D.   Procedure Report  DATE OF BIRTH:  May 03, 1956  REFERRING PHYSICIAN:  PROCEDURE PERFORMED:  Colonoscopy.  ENDOSCOPIST:  Verlin Grills, M.D.  INDICATIONS FOR PROCEDURE:  The patient is a 55 year old male referred for surveillance colonoscopy and possible polypectomy to prevent colon cancer. Mr. Heckmann father was diagnosed with colon cancer at age 6.  I discussed with the patient the complications associated with colonoscopy and polypectomy including intestinal bleeding and intestinal perforation.  The patient has signed the operative permit.  PREMEDICATION:  Fentanyl 100 mcg.  Versed 10 mg.  ENDOSCOPE:  Olympus pediatric. colonoscope.  DESCRIPTION OF PROCEDURE:  After obtaining informed consent, the patient was placed in the left lateral decubitus position.  I administered intravenous fentanyl and intravenous Versed to achieve sedation for the procedure.  The patients blood pressure, oxygen saturation and cardiac rhythm were monitored throughout the procedure and documented in the medical record.  Anal inspection was normal.  Digital rectal exam was normal.  The Olympus pediatric video colonoscope was then introduced into the rectum and under direct vision, advanced to the cecum as identified by a normal-appearing ileocecal valve.  The ileocecal valve was intubated and the distal ileum inspected.  Colonic preparation for the exam today was excellent.  Rectum:  Normal.  Sigmoid colon and descending colon:  Normal.  Splenic flexure:  Normal.  Transverse colon:  Normal.  Hepatic flexure:  Normal.  Ascending colon:  Normal.  Cecum and ileocecal valve:  Normal.  Distal ileum:  Normal.  ASSESSMENT:  Normal proctocolonoscopy to the cecum with  intubation of the ileocecal valve and distal ileum inspection.  No endoscopic evidence for the presence of colorectal neoplasia.  RECOMMENDATIONS:  Repeat colonoscopy in 5 to 10 years. DD:  12/21/00 TD:  12/21/00 Job: [id number]

## 2011-03-10 ENCOUNTER — Encounter (HOSPITAL_COMMUNITY): Payer: BC Managed Care – PPO

## 2011-03-12 ENCOUNTER — Encounter (HOSPITAL_COMMUNITY): Payer: BC Managed Care – PPO

## 2011-03-14 ENCOUNTER — Encounter (HOSPITAL_COMMUNITY): Payer: BC Managed Care – PPO

## 2011-03-14 NOTE — Discharge Summary (Signed)
  NAMEJAHVON, [name] NO.:  [id number]  MEDICAL RECORD NO.:  [id number]           PATIENT TYPE:  I  LOCATION:  6522                         FACILITY:  [facility]  PHYSICIAN:  [name], MD      DATE OF BIRTH:  18-Sep-1956  DATE OF ADMISSION:  12/24/2010 DATE OF DISCHARGE:  12/24/2010                              DISCHARGE SUMMARY   PROCEDURE:  Cardiac catheterization.  FINAL DIAGNOSES: 1. Coronary artery disease. 2. Chest pain. 3. Status post drug-eluting stent to obtuse marginal branch November 05, 2010. 4. Abnormal EKG with deep T-wave inversion especially in the lateral     leads.  BRIEF HOSPITAL COURSE:  A 55 year old male who was taken to the Cardiac Catheterization Lab after a stent procedure was done on November 05, 2010, after he was experiencing some chest tightness and deep T-wave inversions on EKG.  Cardiac catheterization was performed and obtuse marginal stent was widely patent and there were no other abnormalities present.  There was an 80% to 90% stenosis in the small caliber ramus branch, which was seen on previous angiography.  Ejection fraction was normal.  He was discharged that day after cardiac catheterization via the right radial artery approach.  Discharge medications were not changed.  He is currently taking isosorbide.  Please see med reconciliation for full details.  He was stable on discharge.  He has followup in 1 week.  Labs were reviewed.     [name], MD     MCS/MEDQ  D:  03/12/2011  T:  03/12/2011  Job:  [id number]  Electronically Signed by Donato Schultz MD on 03/14/2011 06:16:10 AM

## 2011-03-17 ENCOUNTER — Encounter (HOSPITAL_COMMUNITY): Payer: BC Managed Care – PPO

## 2011-03-19 ENCOUNTER — Encounter (HOSPITAL_COMMUNITY): Payer: BC Managed Care – PPO

## 2011-03-21 ENCOUNTER — Encounter (HOSPITAL_COMMUNITY): Payer: BC Managed Care – PPO | Attending: Cardiology

## 2011-03-21 DIAGNOSIS — E785 Hyperlipidemia, unspecified: Secondary | ICD-10-CM | POA: Insufficient documentation

## 2011-03-21 DIAGNOSIS — Z9861 Coronary angioplasty status: Secondary | ICD-10-CM | POA: Insufficient documentation

## 2011-03-21 DIAGNOSIS — I200 Unstable angina: Secondary | ICD-10-CM | POA: Insufficient documentation

## 2011-03-21 DIAGNOSIS — Z5189 Encounter for other specified aftercare: Secondary | ICD-10-CM | POA: Insufficient documentation

## 2011-03-21 DIAGNOSIS — I2510 Atherosclerotic heart disease of native coronary artery without angina pectoris: Secondary | ICD-10-CM | POA: Insufficient documentation

## 2011-03-21 DIAGNOSIS — E119 Type 2 diabetes mellitus without complications: Secondary | ICD-10-CM | POA: Insufficient documentation

## 2011-03-21 DIAGNOSIS — K7689 Other specified diseases of liver: Secondary | ICD-10-CM | POA: Insufficient documentation

## 2011-03-21 DIAGNOSIS — E669 Obesity, unspecified: Secondary | ICD-10-CM | POA: Insufficient documentation

## 2011-03-21 DIAGNOSIS — I10 Essential (primary) hypertension: Secondary | ICD-10-CM | POA: Insufficient documentation

## 2011-03-24 ENCOUNTER — Encounter (HOSPITAL_COMMUNITY): Payer: BC Managed Care – PPO

## 2011-03-26 ENCOUNTER — Encounter (HOSPITAL_COMMUNITY): Payer: BC Managed Care – PPO

## 2011-03-28 ENCOUNTER — Encounter (HOSPITAL_COMMUNITY): Payer: BC Managed Care – PPO

## 2011-03-31 ENCOUNTER — Encounter (HOSPITAL_COMMUNITY): Payer: BC Managed Care – PPO

## 2011-04-02 ENCOUNTER — Encounter (HOSPITAL_COMMUNITY): Payer: BC Managed Care – PPO

## 2011-04-04 ENCOUNTER — Encounter (HOSPITAL_COMMUNITY): Payer: BC Managed Care – PPO

## 2011-06-07 IMAGING — CR DG CHEST 2V
2 series · 2 of 2 positions shown · non-contrast
Comparison: [DATE] for

CLINICAL DATA: Chest pain and short of breath

CHEST - 2 VIEW

[w chest pa]
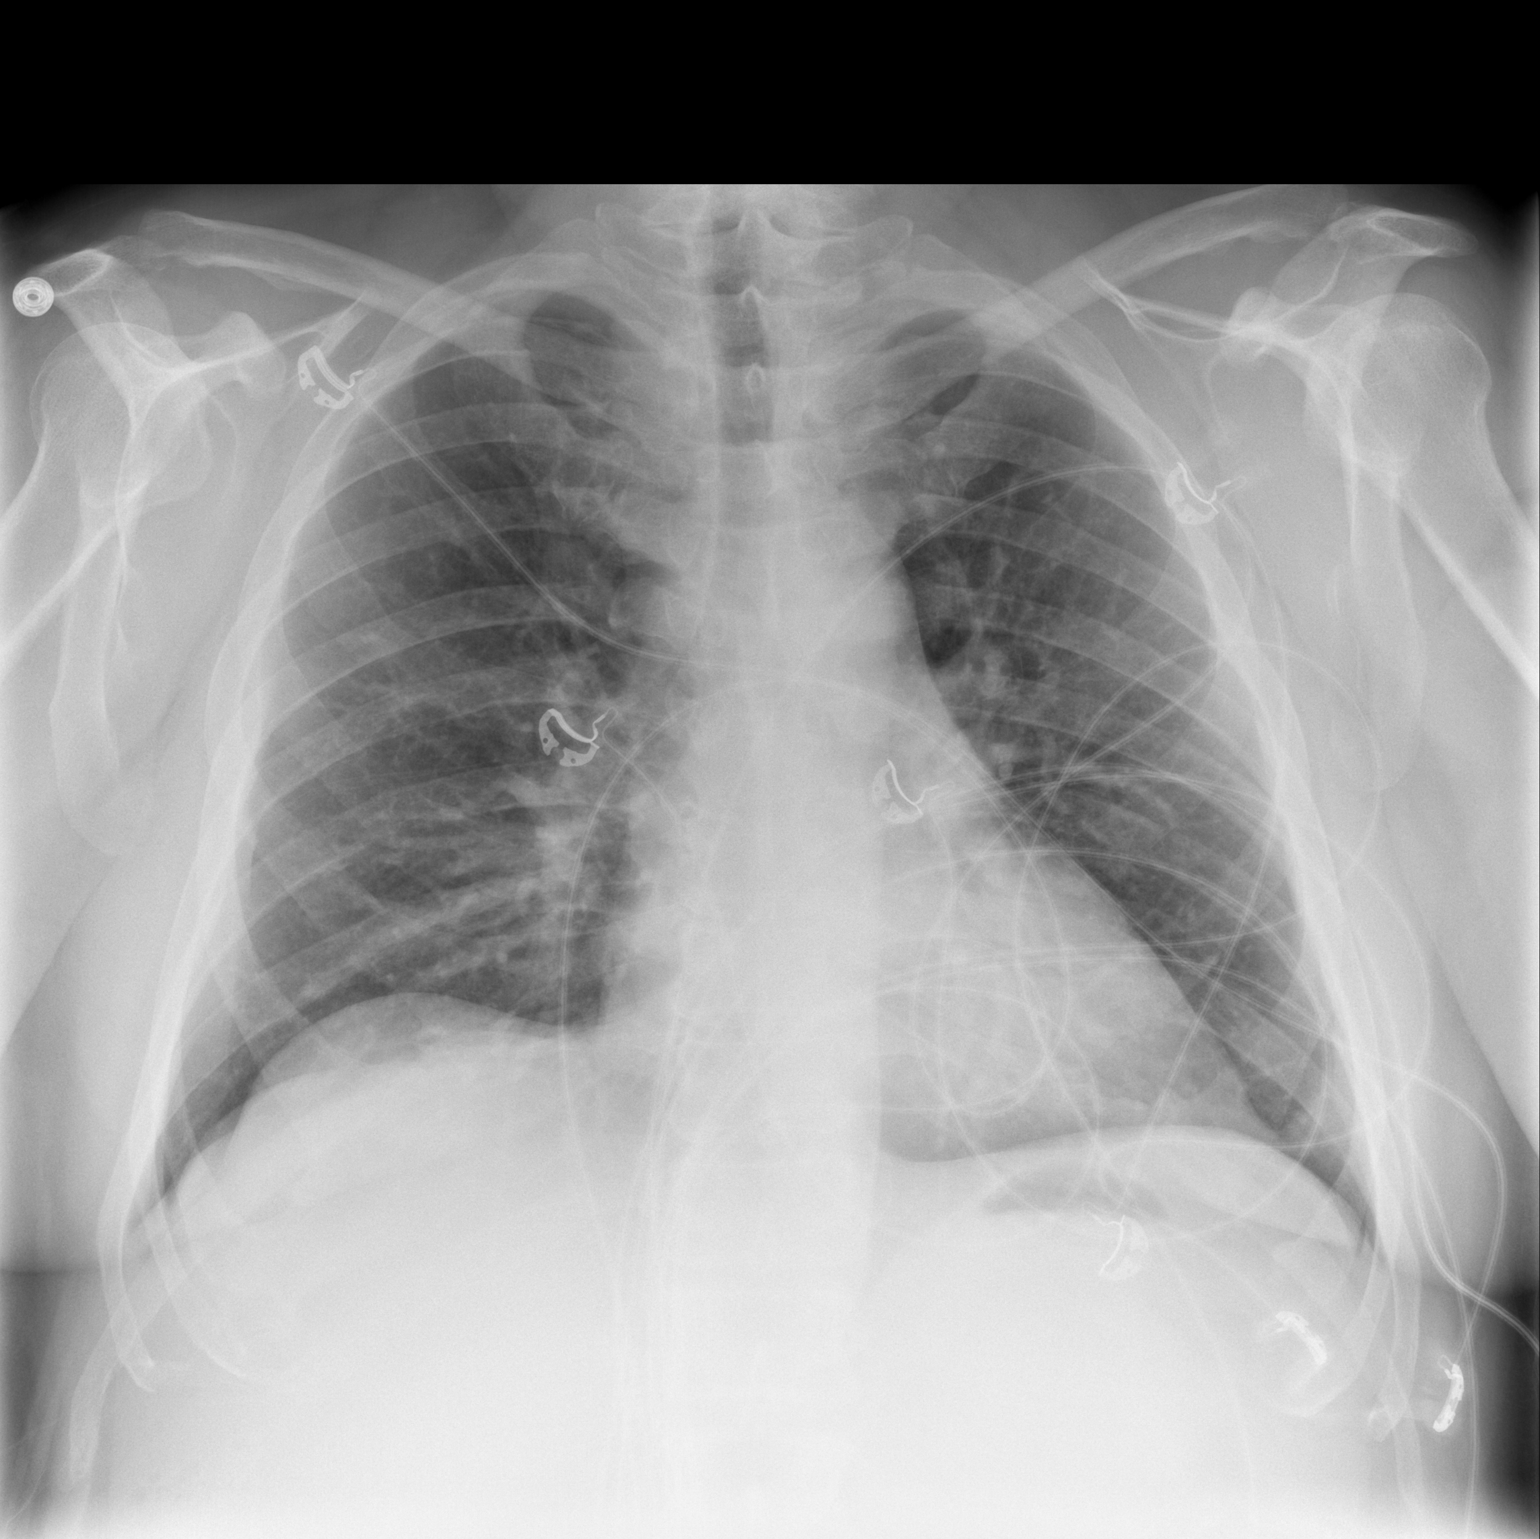

[w chest lat]
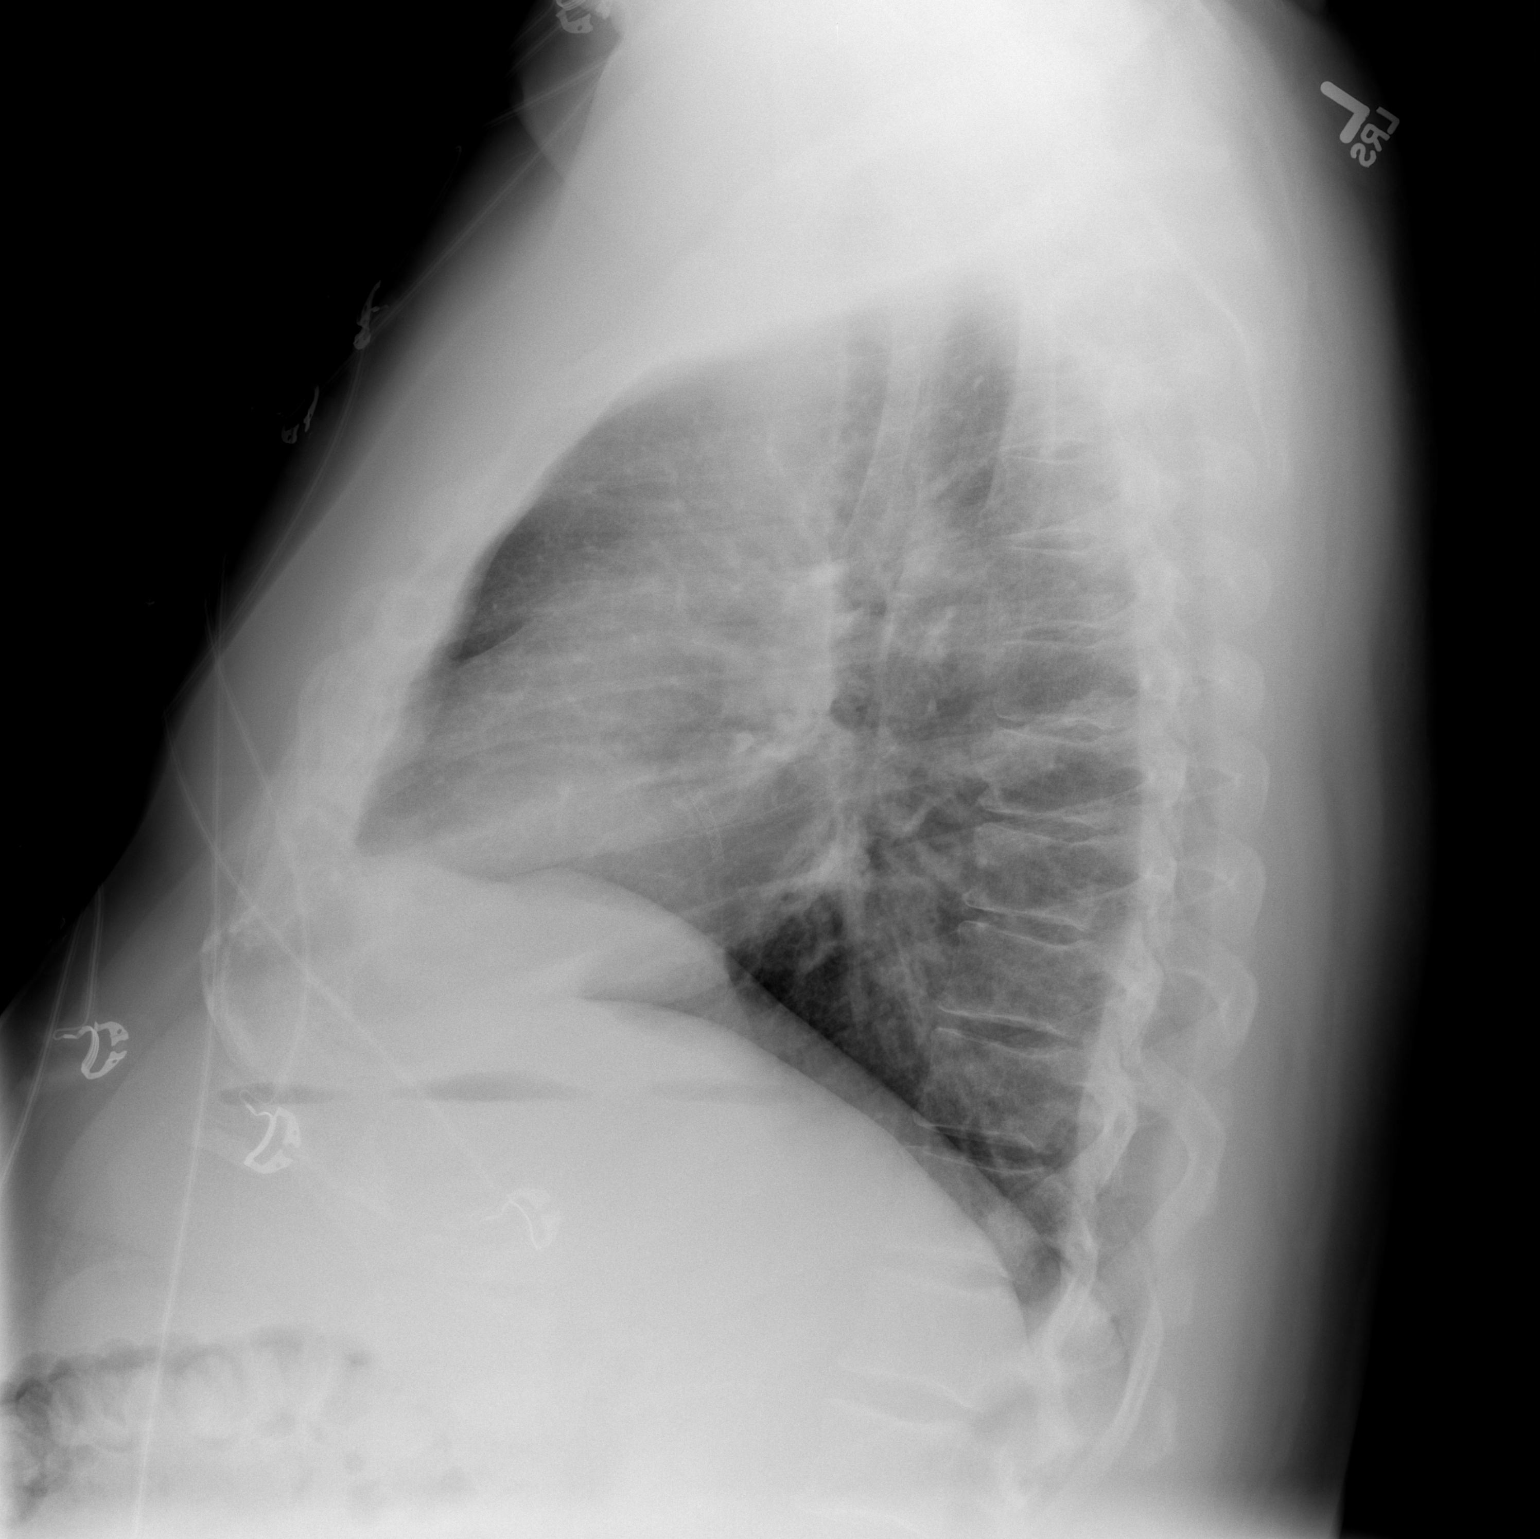

[2 of 2 positions shown; findings below may reference images not displayed]

FINDINGS: Heart size within normal limits.  Right coronary stent
noted.  Negative for heart failure.  Lungs are clear without
infiltrate or effusion.
IMPRESSION: No active cardiopulmonary disease.

## 2011-12-02 ENCOUNTER — Other Ambulatory Visit: Payer: Self-pay | Admitting: Internal Medicine

## 2011-12-02 DIAGNOSIS — R1011 Right upper quadrant pain: Secondary | ICD-10-CM

## 2011-12-03 ENCOUNTER — Ambulatory Visit
Admission: RE | Admit: 2011-12-03 | Discharge: 2011-12-03 | Disposition: A | Discharge status: Home / Self Care | Payer: BC Managed Care – PPO | Source: Ambulatory Visit | Attending: Internal Medicine | Admitting: Internal Medicine

## 2011-12-03 DIAGNOSIS — R1011 Right upper quadrant pain: Secondary | ICD-10-CM

## 2012-03-05 IMAGING — US US ABDOMEN COMPLETE
1 series · 14 of 25 positions shown · non-contrast
Comparison: Dictated report from ultrasound of the abdomen of
01/02/2000

CLINICAL DATA: Right upper quadrant abdominal pain for several
months

COMPLETE ABDOMINAL ULTRASOUND

[Series 1: us abdomen complete · 0.28mm/px · 14 of 65 slices shown]
[im 1/65]
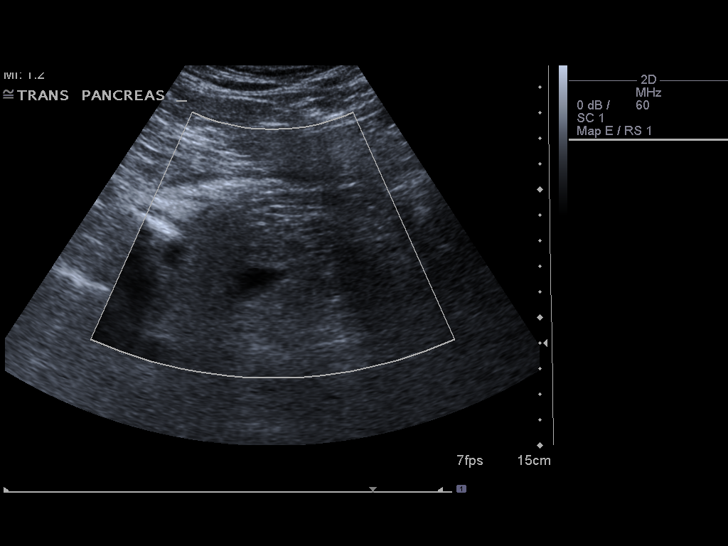
[im 6/65]
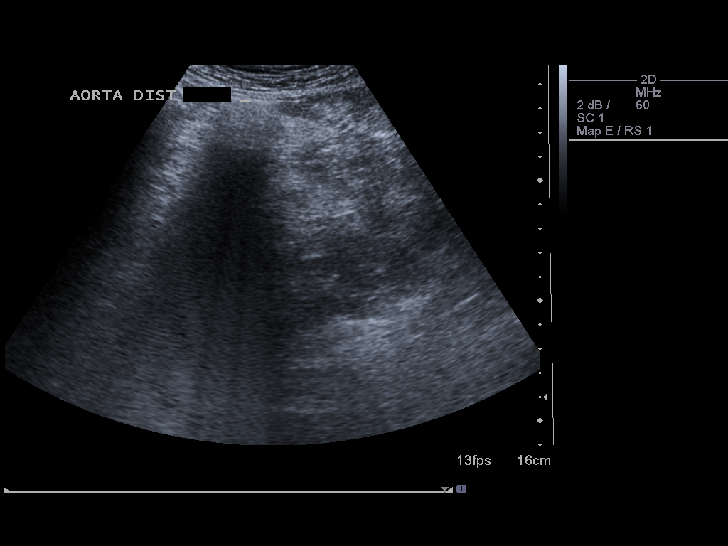
[im 11/65]
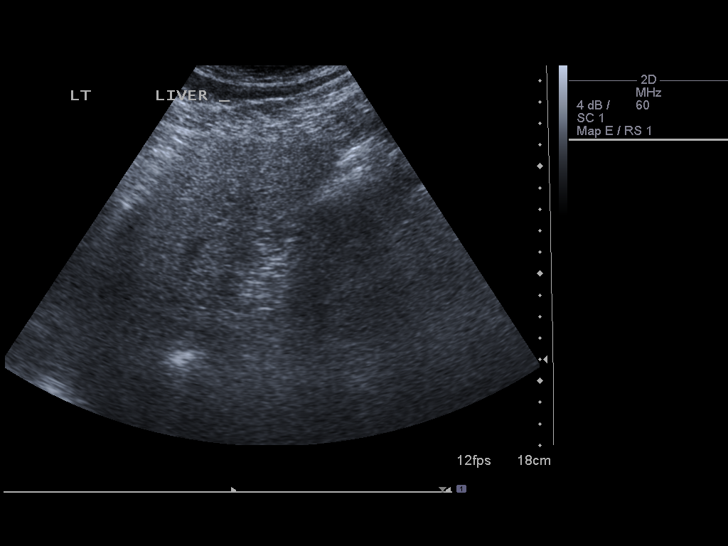
[im 17/65]
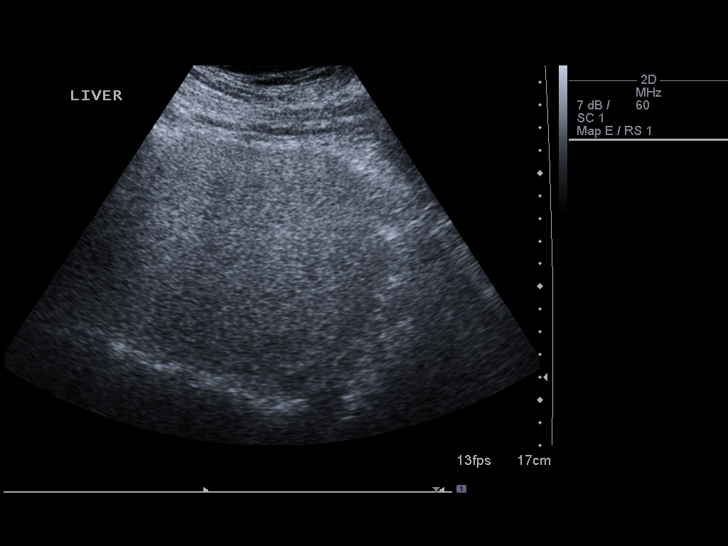
[im 22/65]
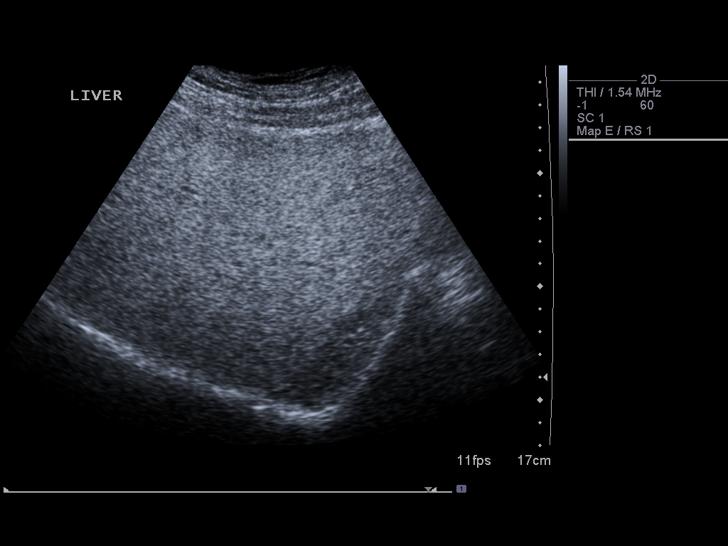
[im 25/65]
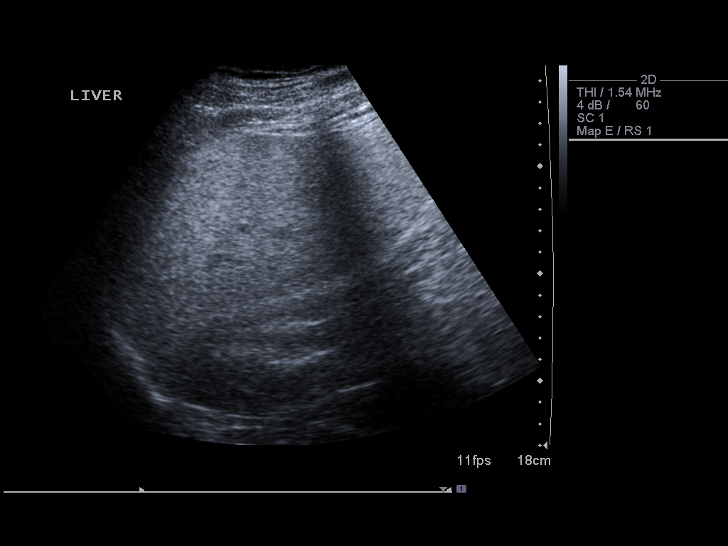
[im 30/65]
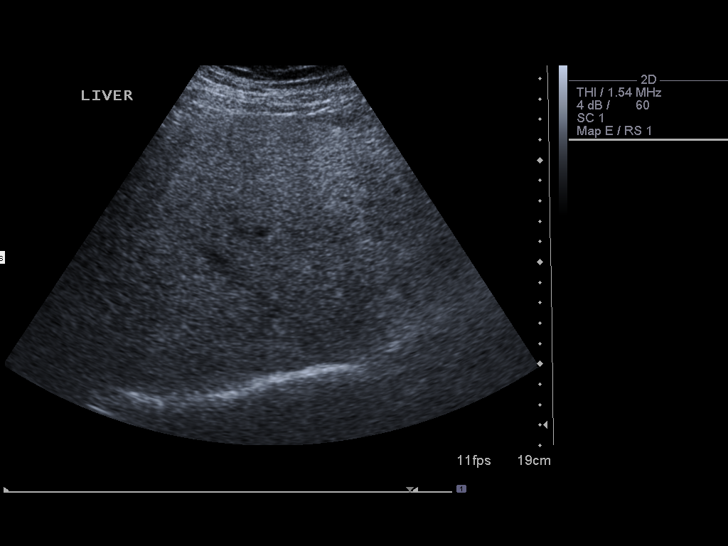
[im 35/65]
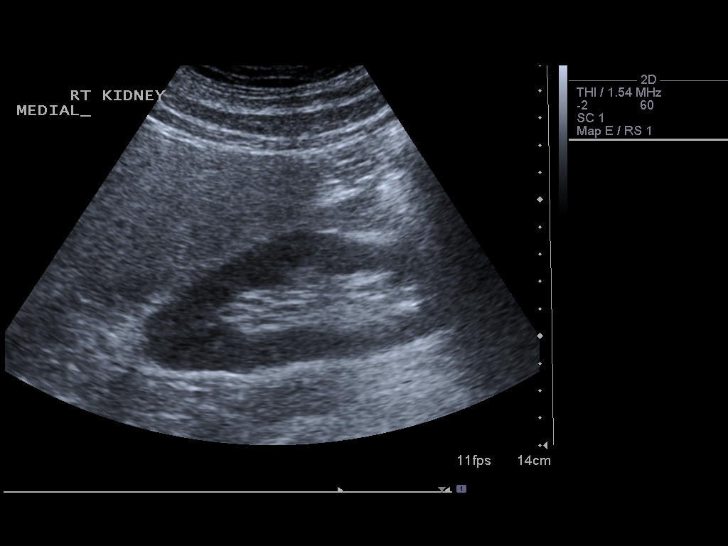
[im 41/65]
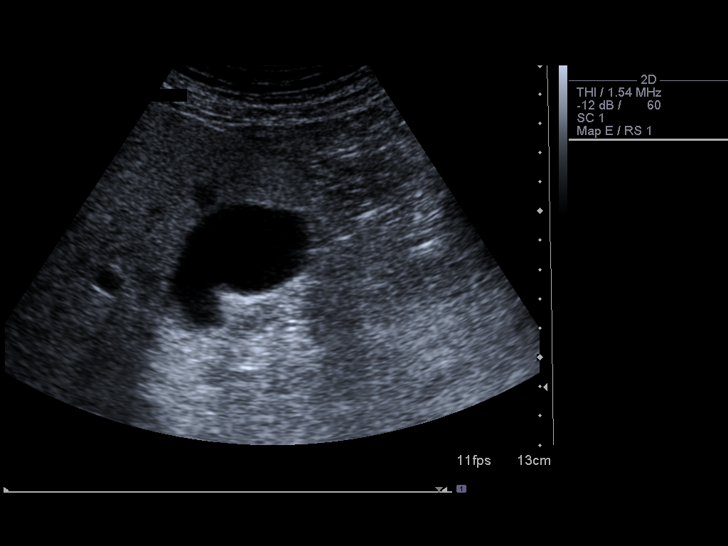
[im 43/65]
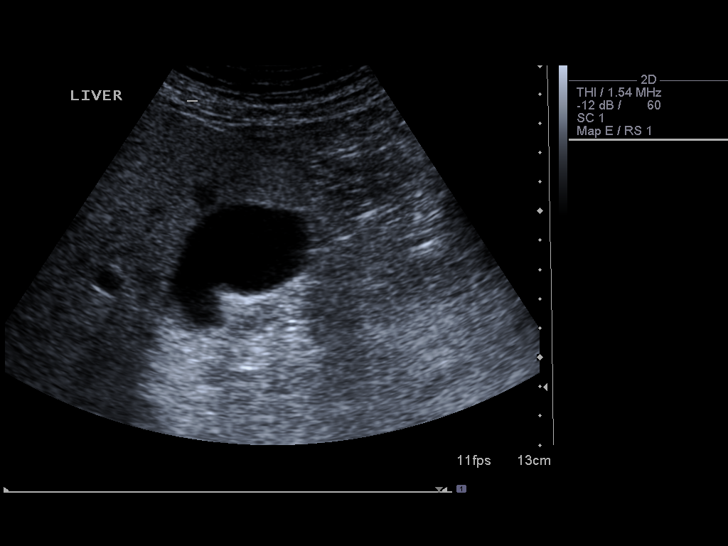
[im 49/65]
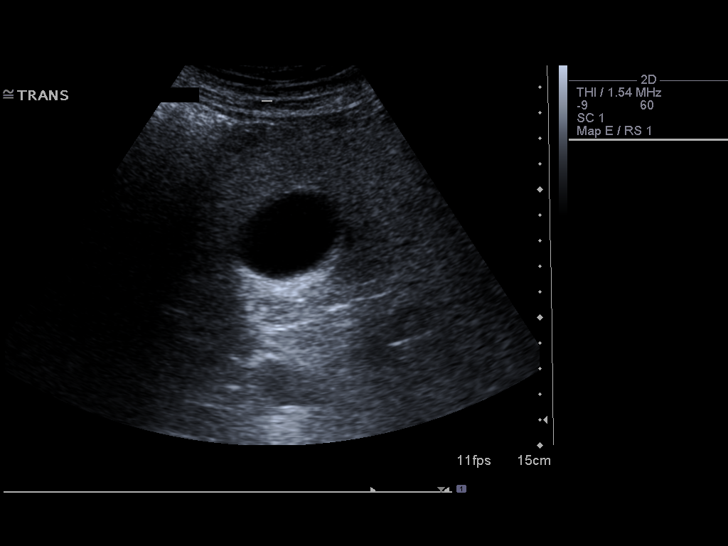
[im 54/65]
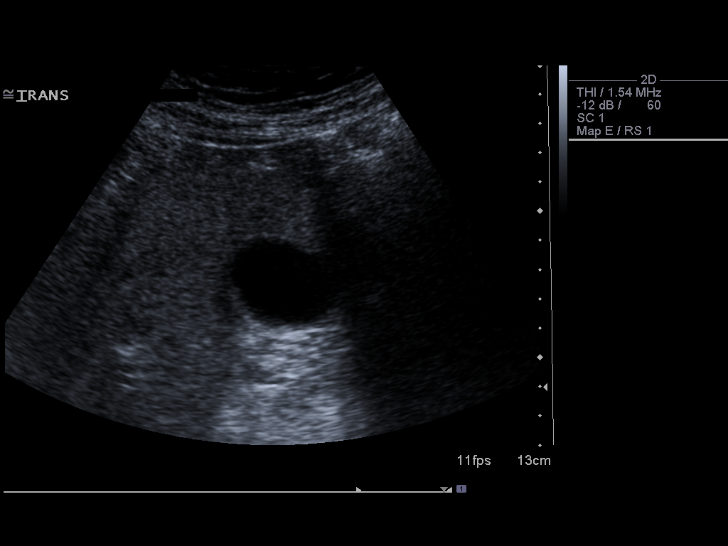
[im 59/65]
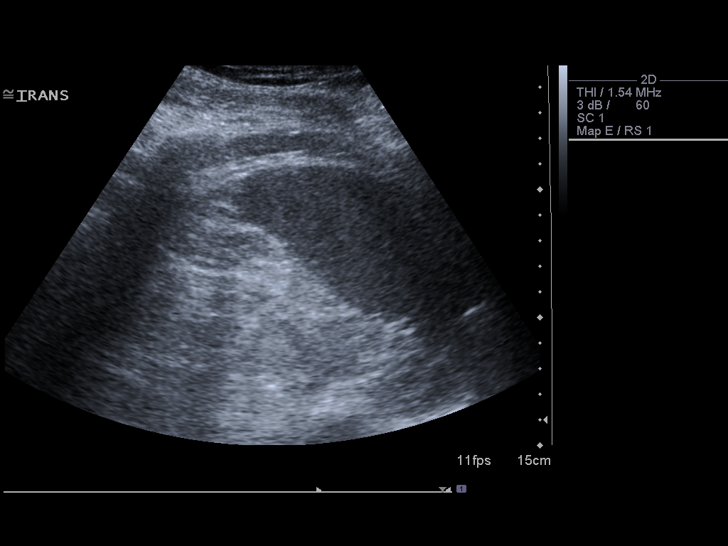
[im 65/65]
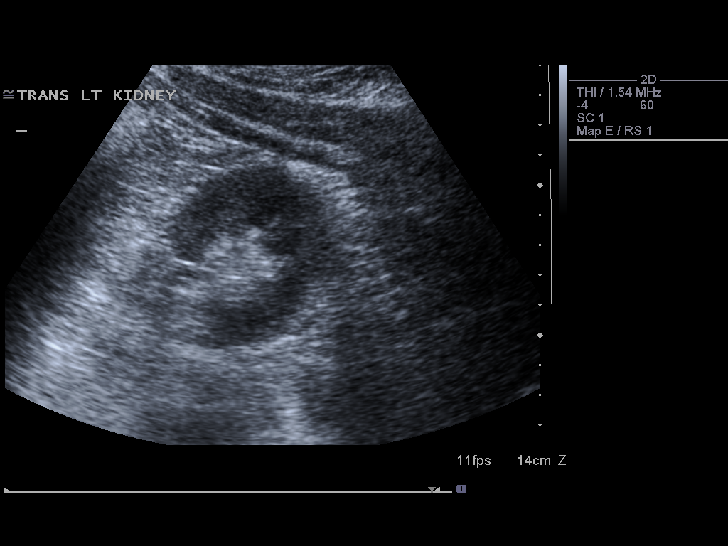

[14 of 25 positions shown; findings below may reference images not displayed]

FINDINGS: Gallbladder:  The gallbladder is visualized and no gallstones are
noted.  There is no pain over the gallbladder with compression.

Common bile duct:  The common bile duct is normal measuring 2.9 mm
in diameter.

Liver:  The liver is diffusely echogenic consistent with fatty
infiltration with sparing.  No ductal dilatation is seen.

IVC:  The IVC is obscured by bowel gas.

Pancreas:  The pancreas also is largely obscured by bowel gas.

Spleen:  The spleen is normal measuring 10.0 cm sagittally.

Right Kidney:  No hydronephrosis is seen.  The right kidney
measures 12.2 cm sagittally.

Left Kidney:  No hydronephrosis.  The left kidney measures 10.9 cm.

Abdominal aorta:  The abdominal aorta is obscured by bowel gas with
no focal aneurysm noted.
IMPRESSION: 1.  Diffuse fatty infiltration of the liver with areas of fatty
sparing.  No ductal dilatation.
2.  No gallstones.
3.  The pancreas and much of the abdominal aorta are obscured by
bowel gas.

## 2013-09-22 ENCOUNTER — Ambulatory Visit (INDEPENDENT_AMBULATORY_CARE_PROVIDER_SITE_OTHER): Payer: BC Managed Care – PPO | Admitting: Podiatry

## 2013-09-22 ENCOUNTER — Ambulatory Visit (INDEPENDENT_AMBULATORY_CARE_PROVIDER_SITE_OTHER): Payer: BC Managed Care – PPO

## 2013-09-22 ENCOUNTER — Encounter: Payer: Self-pay | Admitting: Podiatry

## 2013-09-22 VITALS — BP 175/93 | HR 83 | Resp 16 | Ht 67.0 in | Wt 250.0 lb

## 2013-09-22 DIAGNOSIS — L259 Unspecified contact dermatitis, unspecified cause: Secondary | ICD-10-CM

## 2013-09-22 DIAGNOSIS — M79609 Pain in unspecified limb: Secondary | ICD-10-CM

## 2013-09-22 DIAGNOSIS — S92919A Unspecified fracture of unspecified toe(s), initial encounter for closed fracture: Secondary | ICD-10-CM

## 2013-09-22 DIAGNOSIS — M79671 Pain in right foot: Secondary | ICD-10-CM

## 2013-09-22 NOTE — Progress Notes (Signed)
Subjective:     Patient ID: [name], male   DOB: 03/21/1956, 57 y.o.   MRN: [id number]  Foot Pain   patient presents complaining of thick plantar skin formation and trauma to the right fourth and fifth toes was swelling and discoloration of the nailbeds which occurred several months ago   Review of Systems  All other systems reviewed and are negative.       Objective:   Physical Exam  Nursing note and vitals reviewed. Constitutional: He is oriented to person, place, and time.  Cardiovascular: Intact distal pulses.   Musculoskeletal: Normal range of motion.  Neurological: He is oriented to person, place, and time.  Skin: Skin is warm.   patient's neurovascular status intact with DTR reflexes intact and muscle strength adequate both feet. Significant dryness of the plantar skin around the heels and into the arch of both feet and swelling of the fourth and fifth toes right with nail discoloration     Assessment:     Trauma to the right fourth and fifth toes and chronic skin condition secondary to family history    Plan:     Reviewed H&P and x-ray of the right foot and allow patient to return to shoe gear at this time. begin Vaseline and revitaderm usage currently and reappoint him recheck as needed

## 2013-09-22 NOTE — Patient Instructions (Addendum)
Diabetes and Foot Care Diabetes may cause you to have problems because of poor blood supply (circulation) to your feet and legs. This may cause the skin on your feet to become thinner, break easier, and heal more slowly. Your skin may become dry, and the skin may peel and crack. You may also have nerve damage in your legs and feet causing decreased feeling in them. You may not notice minor injuries to your feet that could lead to infections or more serious problems. Taking care of your feet is one of the most important things you can do for yourself.  HOME CARE INSTRUCTIONS  Wear shoes at all times, even in the house. Do not go barefoot. Bare feet are easily injured.  Check your feet daily for blisters, cuts, and redness. If you cannot see the bottom of your feet, use a mirror or ask someone for help.  Wash your feet with warm water (do not use hot water) and mild soap. Then pat your feet and the areas between your toes until they are completely dry. Do not soak your feet as this can dry your skin.  Apply a moisturizing lotion or petroleum jelly (that does not contain alcohol and is unscented) to the skin on your feet and to dry, brittle toenails. Do not apply lotion between your toes.  Trim your toenails straight across. Do not dig under them or around the cuticle. File the edges of your nails with an emery board or nail file.  Do not cut corns or calluses or try to remove them with medicine.  Wear clean socks or stockings every day. Make sure they are not too tight. Do not wear knee-high stockings since they may decrease blood flow to your legs.  Wear shoes that fit properly and have enough cushioning. To break in new shoes, wear them for just a few hours a day. This prevents you from injuring your feet. Always look in your shoes before you put them on to be sure there are no objects inside.  Do not cross your legs. This may decrease the blood flow to your feet.  If you find a minor scrape,  cut, or break in the skin on your feet, keep it and the skin around it clean and dry. These areas may be cleansed with mild soap and water. Do not cleanse the area with peroxide, alcohol, or iodine.  When you remove an adhesive bandage, be sure not to damage the skin around it.  If you have a wound, look at it several times a day to make sure it is healing.  Do not use heating pads or hot water bottles. They may burn your skin. If you have lost feeling in your feet or legs, you may not know it is happening until it is too late.  Make sure your health care provider performs a complete foot exam at least annually or more often if you have foot problems. Report any cuts, sores, or bruises to your health care provider immediately. SEEK MEDICAL CARE IF:   You have an injury that is not healing.  You have cuts or breaks in the skin.  You have an ingrown nail.  You notice redness on your legs or feet.  You feel burning or tingling in your legs or feet.  You have pain or cramps in your legs and feet.  Your legs or feet are numb.  Your feet always feel cold. SEEK IMMEDIATE MEDICAL CARE IF:   There is increasing redness,   swelling, or pain in or around a wound.  There is a red line that goes up your leg.  Pus is coming from a wound.  You develop a fever or as directed by your health care provider.  You notice a bad smell coming from an ulcer or wound. Document Released: 10/03/2000 Document Revised: 06/08/2013 Document Reviewed: 03/15/2013 [facility] Patient Information 2014 [place], [name]. vaseline twice a week wrapping in saran wrap and white sock

## 2013-09-22 NOTE — Progress Notes (Signed)
   Subjective:    Patient ID: [name], male    DOB: 1956-06-03, 57 y.o.   MRN: [id number]  HPI Comments: "I get hard skin on the bottom of my feet and also I hurt my right toes about 1 month ago."   Patient c/o of hard, thick, callused skin that cracks and becomes painful. Never noticed any bleeding. Tired lotion to soften, gets some better but returns again. It is painful to walk a lot. Pt concerned since he is diabetic.  Also, he dropped a piece of wood onto lateral side of right foot and toe. Bruised and swollen some. Painful with shoes and walking.     Review of Systems  All other systems reviewed and are negative.       Objective:   Physical Exam        Assessment & Plan:

## 2018-07-04 DIAGNOSIS — Z23 Encounter for immunization: Secondary | ICD-10-CM | POA: Diagnosis not present

## 2018-09-30 DIAGNOSIS — Z0000 Encounter for general adult medical examination without abnormal findings: Secondary | ICD-10-CM | POA: Diagnosis not present

## 2018-09-30 DIAGNOSIS — I10 Essential (primary) hypertension: Secondary | ICD-10-CM | POA: Diagnosis not present

## 2018-09-30 DIAGNOSIS — J309 Allergic rhinitis, unspecified: Secondary | ICD-10-CM | POA: Diagnosis not present

## 2018-09-30 DIAGNOSIS — Z1389 Encounter for screening for other disorder: Secondary | ICD-10-CM | POA: Diagnosis not present

## 2018-09-30 DIAGNOSIS — E1121 Type 2 diabetes mellitus with diabetic nephropathy: Secondary | ICD-10-CM | POA: Diagnosis not present

## 2018-09-30 DIAGNOSIS — E119 Type 2 diabetes mellitus without complications: Secondary | ICD-10-CM | POA: Diagnosis not present

## 2018-10-22 DIAGNOSIS — L718 Other rosacea: Secondary | ICD-10-CM | POA: Diagnosis not present

## 2019-02-07 DIAGNOSIS — E1165 Type 2 diabetes mellitus with hyperglycemia: Secondary | ICD-10-CM | POA: Diagnosis not present

## 2019-02-07 DIAGNOSIS — B354 Tinea corporis: Secondary | ICD-10-CM | POA: Diagnosis not present

## 2019-02-18 DIAGNOSIS — B354 Tinea corporis: Secondary | ICD-10-CM | POA: Diagnosis not present

## 2019-02-28 DIAGNOSIS — R21 Rash and other nonspecific skin eruption: Secondary | ICD-10-CM | POA: Diagnosis not present

## 2019-03-01 DIAGNOSIS — R21 Rash and other nonspecific skin eruption: Secondary | ICD-10-CM | POA: Diagnosis not present

## 2019-03-02 DIAGNOSIS — L280 Lichen simplex chronicus: Secondary | ICD-10-CM | POA: Diagnosis not present

## 2019-03-02 DIAGNOSIS — L308 Other specified dermatitis: Secondary | ICD-10-CM | POA: Diagnosis not present

## 2019-07-07 DIAGNOSIS — E119 Type 2 diabetes mellitus without complications: Secondary | ICD-10-CM | POA: Diagnosis not present

## 2019-11-25 DIAGNOSIS — E1165 Type 2 diabetes mellitus with hyperglycemia: Secondary | ICD-10-CM | POA: Diagnosis not present

## 2019-11-25 DIAGNOSIS — Z0000 Encounter for general adult medical examination without abnormal findings: Secondary | ICD-10-CM | POA: Diagnosis not present

## 2019-11-25 DIAGNOSIS — Z1389 Encounter for screening for other disorder: Secondary | ICD-10-CM | POA: Diagnosis not present

## 2019-11-25 DIAGNOSIS — I2510 Atherosclerotic heart disease of native coronary artery without angina pectoris: Secondary | ICD-10-CM | POA: Diagnosis not present

## 2019-11-25 DIAGNOSIS — Z125 Encounter for screening for malignant neoplasm of prostate: Secondary | ICD-10-CM | POA: Diagnosis not present

## 2019-11-25 DIAGNOSIS — I10 Essential (primary) hypertension: Secondary | ICD-10-CM | POA: Diagnosis not present

## 2019-11-25 DIAGNOSIS — E782 Mixed hyperlipidemia: Secondary | ICD-10-CM | POA: Diagnosis not present

## 2019-12-15 ENCOUNTER — Ambulatory Visit: Payer: Medicare Other | Attending: Internal Medicine

## 2019-12-15 DIAGNOSIS — Z23 Encounter for immunization: Secondary | ICD-10-CM | POA: Insufficient documentation

## 2019-12-15 NOTE — Progress Notes (Signed)
   Covid-19 Vaccination Clinic  Name:  RYSZARD SOCARRAS    MRN: [id number] DOB: 10/18/1956  12/15/2019  Mr. Anschutz was observed post Covid-19 immunization for 15 minutes without incidence. He was provided with Vaccine Information Sheet and instruction to access the V-Safe system.   Mr. Cech was instructed to call 911 with any severe reactions post vaccine: Marland Kitchen Difficulty breathing  . Swelling of your face and throat  . A fast heartbeat  . A bad rash all over your body  . Dizziness and weakness    Immunizations Administered    Name Date Dose VIS Date Route   Pfizer COVID-19 Vaccine 12/15/2019 10:16 AM 0.3 mL 09/30/2019 Intramuscular   Manufacturer: [employer], [employer]   Lot: [id number]   NDC: [address]-7

## 2020-01-11 ENCOUNTER — Ambulatory Visit: Payer: Medicare Other | Attending: Internal Medicine

## 2020-01-11 DIAGNOSIS — Z23 Encounter for immunization: Secondary | ICD-10-CM

## 2020-01-11 NOTE — Progress Notes (Signed)
   Covid-19 Vaccination Clinic  Name:  [name]    MRN: [id number] DOB: 07-18-1956  01/11/2020  Mr. Bartelt was observed post Covid-19 immunization for 15 minutes without incident. He was provided with Vaccine Information Sheet and instruction to access the V-Safe system.   Mr. Mcquitty was instructed to call 911 with any severe reactions post vaccine: Marland Kitchen Difficulty breathing  . Swelling of face and throat  . A fast heartbeat  . A bad rash all over body  . Dizziness and weakness   Immunizations Administered    Name Date Dose VIS Date Route   Pfizer COVID-19 Vaccine 01/11/2020  8:27 AM 0.3 mL 09/30/2019 Intramuscular   Manufacturer: [employer], [employer]   Lot: [id number]   NDC: [address]-0

## 2020-02-24 DIAGNOSIS — E782 Mixed hyperlipidemia: Secondary | ICD-10-CM | POA: Diagnosis not present

## 2020-02-24 DIAGNOSIS — I10 Essential (primary) hypertension: Secondary | ICD-10-CM | POA: Diagnosis not present

## 2020-02-24 DIAGNOSIS — E1121 Type 2 diabetes mellitus with diabetic nephropathy: Secondary | ICD-10-CM | POA: Diagnosis not present

## 2020-02-24 DIAGNOSIS — E1165 Type 2 diabetes mellitus with hyperglycemia: Secondary | ICD-10-CM | POA: Diagnosis not present

## 2020-05-28 DIAGNOSIS — I2510 Atherosclerotic heart disease of native coronary artery without angina pectoris: Secondary | ICD-10-CM | POA: Diagnosis not present

## 2020-05-28 DIAGNOSIS — I10 Essential (primary) hypertension: Secondary | ICD-10-CM | POA: Diagnosis not present

## 2020-05-28 DIAGNOSIS — E782 Mixed hyperlipidemia: Secondary | ICD-10-CM | POA: Diagnosis not present

## 2020-05-28 DIAGNOSIS — E1121 Type 2 diabetes mellitus with diabetic nephropathy: Secondary | ICD-10-CM | POA: Diagnosis not present

## 2020-08-27 DIAGNOSIS — E119 Type 2 diabetes mellitus without complications: Secondary | ICD-10-CM | POA: Diagnosis not present

## 2020-08-27 DIAGNOSIS — H524 Presbyopia: Secondary | ICD-10-CM | POA: Diagnosis not present

## 2020-08-27 DIAGNOSIS — H5213 Myopia, bilateral: Secondary | ICD-10-CM | POA: Diagnosis not present

## 2020-08-28 DIAGNOSIS — I2510 Atherosclerotic heart disease of native coronary artery without angina pectoris: Secondary | ICD-10-CM | POA: Diagnosis not present

## 2020-08-28 DIAGNOSIS — E1121 Type 2 diabetes mellitus with diabetic nephropathy: Secondary | ICD-10-CM | POA: Diagnosis not present

## 2020-08-28 DIAGNOSIS — E1165 Type 2 diabetes mellitus with hyperglycemia: Secondary | ICD-10-CM | POA: Diagnosis not present

## 2020-08-28 DIAGNOSIS — I10 Essential (primary) hypertension: Secondary | ICD-10-CM | POA: Diagnosis not present

## 2020-12-04 DIAGNOSIS — E782 Mixed hyperlipidemia: Secondary | ICD-10-CM | POA: Diagnosis not present

## 2020-12-04 DIAGNOSIS — I10 Essential (primary) hypertension: Secondary | ICD-10-CM | POA: Diagnosis not present

## 2020-12-04 DIAGNOSIS — Z125 Encounter for screening for malignant neoplasm of prostate: Secondary | ICD-10-CM | POA: Diagnosis not present

## 2020-12-04 DIAGNOSIS — E1121 Type 2 diabetes mellitus with diabetic nephropathy: Secondary | ICD-10-CM | POA: Diagnosis not present

## 2020-12-04 DIAGNOSIS — Z0000 Encounter for general adult medical examination without abnormal findings: Secondary | ICD-10-CM | POA: Diagnosis not present

## 2021-06-04 DIAGNOSIS — E1121 Type 2 diabetes mellitus with diabetic nephropathy: Secondary | ICD-10-CM | POA: Diagnosis not present

## 2021-06-04 DIAGNOSIS — E782 Mixed hyperlipidemia: Secondary | ICD-10-CM | POA: Diagnosis not present

## 2021-06-04 DIAGNOSIS — I10 Essential (primary) hypertension: Secondary | ICD-10-CM | POA: Diagnosis not present

## 2021-06-04 DIAGNOSIS — N182 Chronic kidney disease, stage 2 (mild): Secondary | ICD-10-CM | POA: Diagnosis not present

## 2021-10-08 DIAGNOSIS — E113293 Type 2 diabetes mellitus with mild nonproliferative diabetic retinopathy without macular edema, bilateral: Secondary | ICD-10-CM | POA: Diagnosis not present

## 2021-10-08 DIAGNOSIS — H5213 Myopia, bilateral: Secondary | ICD-10-CM | POA: Diagnosis not present

## 2021-10-08 DIAGNOSIS — H524 Presbyopia: Secondary | ICD-10-CM | POA: Diagnosis not present

## 2021-12-05 DIAGNOSIS — N182 Chronic kidney disease, stage 2 (mild): Secondary | ICD-10-CM | POA: Diagnosis not present

## 2021-12-05 DIAGNOSIS — I10 Essential (primary) hypertension: Secondary | ICD-10-CM | POA: Diagnosis not present

## 2021-12-05 DIAGNOSIS — Z125 Encounter for screening for malignant neoplasm of prostate: Secondary | ICD-10-CM | POA: Diagnosis not present

## 2021-12-05 DIAGNOSIS — E1121 Type 2 diabetes mellitus with diabetic nephropathy: Secondary | ICD-10-CM | POA: Diagnosis not present

## 2021-12-05 DIAGNOSIS — Z0000 Encounter for general adult medical examination without abnormal findings: Secondary | ICD-10-CM | POA: Diagnosis not present

## 2021-12-05 DIAGNOSIS — E782 Mixed hyperlipidemia: Secondary | ICD-10-CM | POA: Diagnosis not present

## 2022-04-01 DIAGNOSIS — E782 Mixed hyperlipidemia: Secondary | ICD-10-CM | POA: Diagnosis not present

## 2022-04-01 DIAGNOSIS — E1121 Type 2 diabetes mellitus with diabetic nephropathy: Secondary | ICD-10-CM | POA: Diagnosis not present

## 2022-04-01 DIAGNOSIS — E1165 Type 2 diabetes mellitus with hyperglycemia: Secondary | ICD-10-CM | POA: Diagnosis not present

## 2022-07-04 DIAGNOSIS — E782 Mixed hyperlipidemia: Secondary | ICD-10-CM | POA: Diagnosis not present

## 2022-07-04 DIAGNOSIS — G720 Drug-induced myopathy: Secondary | ICD-10-CM | POA: Diagnosis not present

## 2022-07-04 DIAGNOSIS — E1165 Type 2 diabetes mellitus with hyperglycemia: Secondary | ICD-10-CM | POA: Diagnosis not present

## 2022-07-04 DIAGNOSIS — I10 Essential (primary) hypertension: Secondary | ICD-10-CM | POA: Diagnosis not present

## 2022-07-10 DIAGNOSIS — Z09 Encounter for follow-up examination after completed treatment for conditions other than malignant neoplasm: Secondary | ICD-10-CM | POA: Diagnosis not present

## 2022-07-10 DIAGNOSIS — K5730 Diverticulosis of large intestine without perforation or abscess without bleeding: Secondary | ICD-10-CM | POA: Diagnosis not present

## 2022-07-10 DIAGNOSIS — Z86010 Personal history of colonic polyps: Secondary | ICD-10-CM | POA: Diagnosis not present

## 2022-07-10 DIAGNOSIS — D123 Benign neoplasm of transverse colon: Secondary | ICD-10-CM | POA: Diagnosis not present

## 2022-07-14 DIAGNOSIS — D123 Benign neoplasm of transverse colon: Secondary | ICD-10-CM | POA: Diagnosis not present

## 2022-10-08 DIAGNOSIS — E1165 Type 2 diabetes mellitus with hyperglycemia: Secondary | ICD-10-CM | POA: Diagnosis not present

## 2022-10-08 DIAGNOSIS — G720 Drug-induced myopathy: Secondary | ICD-10-CM | POA: Diagnosis not present

## 2022-10-08 DIAGNOSIS — E1121 Type 2 diabetes mellitus with diabetic nephropathy: Secondary | ICD-10-CM | POA: Diagnosis not present

## 2022-10-08 DIAGNOSIS — I10 Essential (primary) hypertension: Secondary | ICD-10-CM | POA: Diagnosis not present

## 2022-10-16 DIAGNOSIS — H524 Presbyopia: Secondary | ICD-10-CM | POA: Diagnosis not present

## 2022-10-16 DIAGNOSIS — H5213 Myopia, bilateral: Secondary | ICD-10-CM | POA: Diagnosis not present

## 2022-10-16 DIAGNOSIS — H52213 Irregular astigmatism, bilateral: Secondary | ICD-10-CM | POA: Diagnosis not present

## 2022-10-16 DIAGNOSIS — E113293 Type 2 diabetes mellitus with mild nonproliferative diabetic retinopathy without macular edema, bilateral: Secondary | ICD-10-CM | POA: Diagnosis not present

## 2023-01-07 ENCOUNTER — Ambulatory Visit: Payer: Medicare Other | Admitting: Podiatry

## 2023-01-07 VITALS — BP 148/65

## 2023-01-07 DIAGNOSIS — M79674 Pain in right toe(s): Secondary | ICD-10-CM | POA: Diagnosis not present

## 2023-01-07 DIAGNOSIS — M2012 Hallux valgus (acquired), left foot: Secondary | ICD-10-CM | POA: Diagnosis not present

## 2023-01-07 DIAGNOSIS — M79675 Pain in left toe(s): Secondary | ICD-10-CM

## 2023-01-07 DIAGNOSIS — M2011 Hallux valgus (acquired), right foot: Secondary | ICD-10-CM | POA: Diagnosis not present

## 2023-01-07 DIAGNOSIS — E119 Type 2 diabetes mellitus without complications: Secondary | ICD-10-CM | POA: Diagnosis not present

## 2023-01-07 DIAGNOSIS — Z6836 Body mass index (BMI) 36.0-36.9, adult: Secondary | ICD-10-CM | POA: Insufficient documentation

## 2023-01-07 DIAGNOSIS — B351 Tinea unguium: Secondary | ICD-10-CM

## 2023-01-07 NOTE — Patient Instructions (Signed)

## 2023-01-08 ENCOUNTER — Encounter: Payer: Self-pay | Admitting: Podiatry

## 2023-01-08 DIAGNOSIS — E119 Type 2 diabetes mellitus without complications: Secondary | ICD-10-CM | POA: Insufficient documentation

## 2023-01-08 NOTE — Progress Notes (Signed)
Subjective: Chief Complaint  Patient presents with   Callouses    [facility] BS-205 A1C-10.? PCP-Husain PCP VST-2 months ago   [name] presents today for diabetic foot evaluation.  Patient relates several year h/o diabetes.  Patient denies any h/o foot wounds.  Patient denies any numbness, tingling, burning, or pins/needle sensation in feet.  Risk factors: diabetes, HTN.  PCP is Wenda Low, MD.  Past Medical History:  Diagnosis Date   Asthma    Colon polyp    Diabetes mellitus without complication (HCC)    Elevated LFTs    Fatty liver    Hypertension    Hypogonadism in male    Obesity    Rhinitis     Patient Active Problem List   Diagnosis Date Noted   Type 2 diabetes mellitus ([place])    Body mass index (BMI) 36.0-36.9, adult 01/07/2023    Past Surgical History:  Procedure Laterality Date   CAROTID STENT     UMBILICAL HERNIA REPAIR      Current Outpatient Medications on File Prior to Visit  Medication Sig Dispense Refill   albuterol (PROVENTIL HFA;VENTOLIN HFA) 108 (90 BASE) MCG/ACT inhaler Inhale into the lungs every 6 (six) hours as needed for wheezing or shortness of breath.     aspirin 81 MG tablet Take 81 mg by mouth daily.     carvedilol (COREG) 12.5 MG tablet Take 12.5 mg by mouth 2 (two) times daily with a meal.     cholecalciferol (VITAMIN D3) 25 MCG (1000 UNIT) tablet 1 tablet Orally Once a day     glimepiride (AMARYL) 2 MG tablet Take 2 mg by mouth daily with breakfast.     glucose blood test strip 1 each by Other route as needed for other. Use as instructed     JARDIANCE 25 MG TABS tablet Take 25 mg by mouth daily.     loratadine (CLARITIN) 10 MG tablet Take 10 mg by mouth daily.     metFORMIN (GLUCOPHAGE) 1000 MG tablet Take 1,000 mg by mouth 2 (two) times daily with a meal.     nitroGLYCERIN (NITROSTAT) 0.4 MG SL tablet Place 0.4 mg under the tongue every 5 (five) minutes as needed for chest pain.     ramipril (ALTACE) 5 MG capsule Take 5  mg by mouth daily.     No current facility-administered medications on file prior to visit.     Allergies  Allergen Reactions   Colesevelam     Other Reaction(s): GI upset   Crestor [Rosuvastatin]     Muscle ache   Dulaglutide     Other Reaction(s): gi intolerance   Plavix [Clopidogrel Bisulfate]     Questionable rash   Pravachol [Pravastatin]     myalgia   Semaglutide     Other Reaction(s): GI intol   Statins    Welchol [Colesevelam Hcl]     Gi upset    Zocor [Simvastatin]     Muscle ache    Social History   Occupational History   Not on file  Tobacco Use   Smoking status: Never   Smokeless tobacco: Not on file  Substance and Sexual Activity   Alcohol use: No   Drug use: No   Sexual activity: Not on file    Family History  Problem Relation Age of Onset   Heart disease Father    Colon cancer Father    Diabetes Mellitus I Father    CAD Father     Immunization History  Administered Date(s) Administered   PFIZER(Purple Top)SARS-COV-2 Vaccination 12/15/2019, 01/11/2020    Objective: Vitals:   01/07/23 1031  BP: (!) 148/65    [name] is a pleasant 67 y.o. male obese in NAD. AAO X 3.  Vascular Examination: Capillary refill time immediate b/l. Vascular status intact b/l with palpable pedal pulses. Pedal hair present b/l. No edema. No pain with calf compression b/l. Skin temperature gradient WNL b/l.   Neurological Examination: Sensation grossly intact b/l with 10 gram monofilament. Vibratory sensation intact b/l.   Dermatological Examination: Pedal skin with normal turgor, texture and tone b/l.  No open wounds. No interdigital macerations.   Toenails 1-5 b/l thick, discolored, elongated with subungual debris and pain on dorsal palpation.   No hyperkeratotic nor porokeratotic lesions present on today's visit.  Musculoskeletal Examination: Normal muscle strength 5/5 to all lower extremity muscle groups bilaterally. HAV with bunion deformity  noted b/l LE. Patient ambulates independent of any assistive aids.. No pain, crepitus or joint limitation noted with ROM b/l LE.  Patient ambulates independently without assistive aids.  Radiographs: None  Footwear Assessment: Does the patient wear appropriate shoes? Yes. Does the patient need inserts/orthotics? No.  Lab Results  Component Value Date   HGBA1C (H) 11/04/2010    8.3 (NOTE)                                                                       According to the ADA Clinical Practice Recommendations for 2011, when HbA1c is used as a screening test:   >=6.5%   Diagnostic of Diabetes Mellitus           (if abnormal result  is confirmed)  5.7-6.4%   Increased risk of developing Diabetes Mellitus  References:Diagnosis and Classification of Diabetes Mellitus,Diabetes [id number] 1):S62-S69 and Standards of Medical Care in         Diabetes - 2011,Diabetes [id number]  (Suppl 1):S11-S61. CORRECTED ON 01/17 AT 0119: PREVIOUSLY REPORTED AS 8.3 Reference range: <5.7   Assessment: 1. Pain due to onychomycosis of toenails of both feet   2. Hallux valgus, acquired, bilateral   3. Type 2 diabetes mellitus without complication, without long-term current use of insulin ([place])   4. Encounter for diabetic foot exam ([place])      ADA Risk Categorization: Low Risk:  Patient has all of the following: Intact protective sensation No prior foot ulcer  No severe deformity Pedal pulses present  Plan: -Patient was evaluated and treated. All patient's and/or POA's questions/concerns answered on today's visit. -Diabetic foot examination performed today. -Discussed and educated patient on diabetic foot care, especially with  regards to the vascular, neurological and musculoskeletal systems. -Patient to continue soft, supportive shoe gear daily. -Toenails 1-5 b/l were debrided in length and girth with sterile nail nippers and dremel without iatrogenic bleeding.  -Patient/POA to call should  there be question/concern in the interim.  Return in about 3 months (around 04/09/2023).  Marzetta Board, DPM

## 2023-02-09 DIAGNOSIS — E1121 Type 2 diabetes mellitus with diabetic nephropathy: Secondary | ICD-10-CM | POA: Diagnosis not present

## 2023-02-09 DIAGNOSIS — E113293 Type 2 diabetes mellitus with mild nonproliferative diabetic retinopathy without macular edema, bilateral: Secondary | ICD-10-CM | POA: Diagnosis not present

## 2023-02-09 DIAGNOSIS — E1165 Type 2 diabetes mellitus with hyperglycemia: Secondary | ICD-10-CM | POA: Diagnosis not present

## 2023-04-21 DIAGNOSIS — K080 Exfoliation of teeth due to systemic causes: Secondary | ICD-10-CM | POA: Diagnosis not present

## 2023-05-27 ENCOUNTER — Encounter: Payer: Self-pay | Admitting: Podiatry

## 2023-05-27 ENCOUNTER — Ambulatory Visit (INDEPENDENT_AMBULATORY_CARE_PROVIDER_SITE_OTHER): Payer: Medicare Other | Admitting: Podiatry

## 2023-05-27 DIAGNOSIS — M79674 Pain in right toe(s): Secondary | ICD-10-CM | POA: Diagnosis not present

## 2023-05-27 DIAGNOSIS — B351 Tinea unguium: Secondary | ICD-10-CM

## 2023-05-27 DIAGNOSIS — M79675 Pain in left toe(s): Secondary | ICD-10-CM | POA: Diagnosis not present

## 2023-05-27 DIAGNOSIS — E119 Type 2 diabetes mellitus without complications: Secondary | ICD-10-CM | POA: Diagnosis not present

## 2023-05-27 DIAGNOSIS — I209 Angina pectoris, unspecified: Secondary | ICD-10-CM | POA: Insufficient documentation

## 2023-05-27 NOTE — Progress Notes (Addendum)
  Subjective:  Patient ID: [name], male    DOB: 31-May-1956,  MRN: [id number]  [name] presents to clinic today for: preventative diabetic foot care and painful elongated mycotic toenails 1-5 bilaterally which are tender when wearing enclosed shoe gear. Pain is relieved with periodic professional debridement.  Patient states he trimmed his toenails and one of them bled. Chief Complaint  Patient presents with   Diabetes    [facility] BS - 203 A1C - 10 LVPCP - 03/2023    PCP is Georgann Housekeeper, MD.  Allergies  Allergen Reactions   Colesevelam     Other Reaction(s): GI upset   Crestor [Rosuvastatin]     Muscle ache   Dulaglutide     Other Reaction(s): gi intolerance   Plavix [Clopidogrel Bisulfate]     Questionable rash   Pravachol [Pravastatin]     myalgia   Semaglutide     Other Reaction(s): GI intol   Statins    Welchol [Colesevelam Hcl]     Gi upset    Zocor [Simvastatin]     Muscle ache    Review of Systems: Negative except as noted in the HPI.  Objective: No changes noted in today's physical examination. There were no vitals filed for this visit.  [name] is a pleasant 67 y.o. male in NAD. AAO x 3.  Vascular Examination: Capillary refill time <3 seconds b/l LE. Palpable pedal pulses b/l LE. Digital hair present b/l. No pedal edema b/l. Skin temperature gradient WNL b/l. No varicosities b/l. Marland Kitchen  Dermatological Examination: Pedal skin with normal turgor, texture and tone b/l. No open wounds. No interdigital macerations b/l. Toenails 2-5 b/l thickened, discolored, dystrophic with subungual debris. There is pain on palpation to dorsal aspect of nailplates. Incurvated nailplate both borders of left hallux and both borders of right hallux.  Nail border hypertrophy absent. There is tenderness to palpation. Sign(s) of infection: no clinical signs of infection noted on examination today.. No hyperkeratotic nor porokeratotic lesions present on today's  visit.Marland Kitchen  Neurological Examination: Protective sensation intact with 10 gram monofilament b/l LE. Vibratory sensation intact b/l LE.   Musculoskeletal Examination: Muscle strength 5/5 to all lower extremity muscle groups bilaterally. No pain, crepitus or joint limitation noted with ROM bilateral LE. No gross bony deformities bilaterally.  Assessment/Plan: 1. Pain due to onychomycosis of toenails of both feet   2. Type 2 diabetes mellitus without complication, without long-term current use of insulin (HCC)     -Consent given for treatment as described below: -Examined patient. -Continue foot and shoe inspections daily. Monitor blood glucose per PCP/Endocrinologist's recommendations. -Patient to continue soft, supportive shoe gear daily. -Mycotic toenails 1-5 bilaterally were debrided in length and girth with sterile nail nippers and dremel without incident. -Discussed chronicity of ingrown toenail(s) of bilateral great toes. Recommended patient consider having matrixectomy performed to alleviate chronic ingrown toenail(s). Discussed in-office procedure and post-procedure instructions. Patient's Aic at 10. A1c would have to decrease to 7% range before elective procedure could be considered -No invasive procedure(s) performed. Offending nail border debrided and curretaged bilateral great toes utilizing sterile nail nipper and currette. Border cleansed with alcohol and triple antibiotic ointment. No further treatment required by patient/caregiver. Call office if there are any concerns. -Patient/POA to call should there be question/concern in the interim.   Return in about 3 months (around 08/27/2023).  Freddie Breech, DPM

## 2023-06-17 DIAGNOSIS — I25119 Atherosclerotic heart disease of native coronary artery with unspecified angina pectoris: Secondary | ICD-10-CM | POA: Diagnosis not present

## 2023-06-17 DIAGNOSIS — E782 Mixed hyperlipidemia: Secondary | ICD-10-CM | POA: Diagnosis not present

## 2023-06-17 DIAGNOSIS — Z0000 Encounter for general adult medical examination without abnormal findings: Secondary | ICD-10-CM | POA: Diagnosis not present

## 2023-06-17 DIAGNOSIS — I10 Essential (primary) hypertension: Secondary | ICD-10-CM | POA: Diagnosis not present

## 2023-06-17 DIAGNOSIS — E1121 Type 2 diabetes mellitus with diabetic nephropathy: Secondary | ICD-10-CM | POA: Diagnosis not present

## 2023-06-17 DIAGNOSIS — Z125 Encounter for screening for malignant neoplasm of prostate: Secondary | ICD-10-CM | POA: Diagnosis not present

## 2023-06-17 DIAGNOSIS — E113293 Type 2 diabetes mellitus with mild nonproliferative diabetic retinopathy without macular edema, bilateral: Secondary | ICD-10-CM | POA: Diagnosis not present

## 2023-06-17 DIAGNOSIS — E1165 Type 2 diabetes mellitus with hyperglycemia: Secondary | ICD-10-CM | POA: Diagnosis not present

## 2023-06-18 DIAGNOSIS — E1165 Type 2 diabetes mellitus with hyperglycemia: Secondary | ICD-10-CM | POA: Diagnosis not present

## 2023-07-21 DIAGNOSIS — E119 Type 2 diabetes mellitus without complications: Secondary | ICD-10-CM | POA: Diagnosis not present

## 2023-08-05 DIAGNOSIS — I110 Hypertensive heart disease with heart failure: Secondary | ICD-10-CM | POA: Diagnosis not present

## 2023-08-21 DIAGNOSIS — E119 Type 2 diabetes mellitus without complications: Secondary | ICD-10-CM | POA: Diagnosis not present

## 2023-09-20 DIAGNOSIS — E119 Type 2 diabetes mellitus without complications: Secondary | ICD-10-CM | POA: Diagnosis not present

## 2023-09-30 ENCOUNTER — Ambulatory Visit (INDEPENDENT_AMBULATORY_CARE_PROVIDER_SITE_OTHER): Payer: Medicare Other | Admitting: Podiatry

## 2023-09-30 ENCOUNTER — Encounter: Payer: Self-pay | Admitting: Podiatry

## 2023-09-30 VITALS — Ht 67.0 in | Wt 250.0 lb

## 2023-09-30 DIAGNOSIS — M79674 Pain in right toe(s): Secondary | ICD-10-CM

## 2023-09-30 DIAGNOSIS — B351 Tinea unguium: Secondary | ICD-10-CM | POA: Diagnosis not present

## 2023-09-30 DIAGNOSIS — E119 Type 2 diabetes mellitus without complications: Secondary | ICD-10-CM | POA: Diagnosis not present

## 2023-09-30 DIAGNOSIS — M79675 Pain in left toe(s): Secondary | ICD-10-CM

## 2023-10-08 ENCOUNTER — Encounter: Payer: Self-pay | Admitting: Podiatry

## 2023-10-08 NOTE — Progress Notes (Signed)
  Subjective:  Patient ID: [name], male    DOB: June 18, 1956,  MRN: [id number]  67 y.o. male presents to clinic with  preventative diabetic foot care and painful thick toenails that are difficult to trim. Pain interferes with ambulation. Aggravating factors include wearing enclosed shoe gear. Pain is relieved with periodic professional debridement.  Chief Complaint  Patient presents with   Nail Problem    Pt is here for [facility], last A1C was 8.2 PCP is DR Donette Larry and LOV was 3 months ago.   New problem(s): None   PCP is Georgann Housekeeper, MD.  Allergies  Allergen Reactions   Colesevelam     Other Reaction(s): GI upset   Crestor [Rosuvastatin]     Muscle ache   Dulaglutide     Other Reaction(s): gi intolerance   Plavix [Clopidogrel Bisulfate]     Questionable rash   Pravachol [Pravastatin]     myalgia   Semaglutide     Other Reaction(s): GI intol   Statins    Welchol [Colesevelam Hcl]     Gi upset    Zocor [Simvastatin]     Muscle ache    Review of Systems: Negative except as noted in the HPI.   Objective:  [name] is a pleasant 67 y.o. male obese in NAD.Marland Kitchen AAO x 3.  Vascular Examination: Vascular status intact b/l with palpable pedal pulses. CFT immediate b/l. No edema. No pain with calf compression b/l. Skin temperature gradient WNL b/l.   Neurological Examination: Sensation grossly intact b/l with 10 gram monofilament. Vibratory sensation intact b/l.   Dermatological Examination: Pedal skin with normal turgor, texture and tone b/l. Toenails 1-5 b/l thick, discolored, elongated with subungual debris and pain on dorsal palpation. No hyperkeratotic lesions noted b/l.   Musculoskeletal Examination: Muscle strength 5/5 to b/l LE. No pain, crepitus or joint limitation noted with ROM bilateral LE. No gross bony deformities bilaterally.  Radiographs: None  Last A1c:       No data to display           Assessment:   1. Pain due to onychomycosis of  toenails of both feet   2. Type 2 diabetes mellitus without complication, without long-term current use of insulin (HCC)    Plan:  Patient was evaluated and treated. All patient's and/or POA's questions/concerns addressed on today's visit. Toenails 1-5 debrided in length and girth without incident. Continue soft, supportive shoe gear daily. Report any pedal injuries to medical professional. Call office if there are any questions/concerns. -Continue foot and shoe inspections daily. Monitor blood glucose per PCP/Endocrinologist's recommendations. -Patient/POA to call should there be question/concern in the interim.  Return in about 3 months (around 12/29/2023).  Freddie Breech, DPM      [facility] LOCATION: [address], [place] [address]                   Office [phone]   [facility] LOCATION: [address] [place], [place] [address] Office [phone]

## 2023-10-21 DIAGNOSIS — E119 Type 2 diabetes mellitus without complications: Secondary | ICD-10-CM | POA: Diagnosis not present

## 2023-11-05 DIAGNOSIS — E113293 Type 2 diabetes mellitus with mild nonproliferative diabetic retinopathy without macular edema, bilateral: Secondary | ICD-10-CM | POA: Diagnosis not present

## 2023-11-05 DIAGNOSIS — E1140 Type 2 diabetes mellitus with diabetic neuropathy, unspecified: Secondary | ICD-10-CM | POA: Diagnosis not present

## 2023-11-05 DIAGNOSIS — E1165 Type 2 diabetes mellitus with hyperglycemia: Secondary | ICD-10-CM | POA: Diagnosis not present

## 2023-11-05 DIAGNOSIS — E11319 Type 2 diabetes mellitus with unspecified diabetic retinopathy without macular edema: Secondary | ICD-10-CM | POA: Diagnosis not present

## 2023-11-10 DIAGNOSIS — H5213 Myopia, bilateral: Secondary | ICD-10-CM | POA: Diagnosis not present

## 2023-11-10 DIAGNOSIS — H25043 Posterior subcapsular polar age-related cataract, bilateral: Secondary | ICD-10-CM | POA: Diagnosis not present

## 2023-11-10 DIAGNOSIS — H524 Presbyopia: Secondary | ICD-10-CM | POA: Diagnosis not present

## 2023-11-10 DIAGNOSIS — H52213 Irregular astigmatism, bilateral: Secondary | ICD-10-CM | POA: Diagnosis not present

## 2023-11-10 DIAGNOSIS — E113293 Type 2 diabetes mellitus with mild nonproliferative diabetic retinopathy without macular edema, bilateral: Secondary | ICD-10-CM | POA: Diagnosis not present

## 2023-11-21 DIAGNOSIS — E119 Type 2 diabetes mellitus without complications: Secondary | ICD-10-CM | POA: Diagnosis not present

## 2023-12-07 DIAGNOSIS — K080 Exfoliation of teeth due to systemic causes: Secondary | ICD-10-CM | POA: Diagnosis not present

## 2023-12-19 DIAGNOSIS — E119 Type 2 diabetes mellitus without complications: Secondary | ICD-10-CM | POA: Diagnosis not present

## 2023-12-28 DIAGNOSIS — E1165 Type 2 diabetes mellitus with hyperglycemia: Secondary | ICD-10-CM | POA: Diagnosis not present

## 2023-12-31 DIAGNOSIS — R29898 Other symptoms and signs involving the musculoskeletal system: Secondary | ICD-10-CM | POA: Diagnosis not present

## 2023-12-31 DIAGNOSIS — M25561 Pain in right knee: Secondary | ICD-10-CM | POA: Diagnosis not present

## 2023-12-31 DIAGNOSIS — M549 Dorsalgia, unspecified: Secondary | ICD-10-CM | POA: Diagnosis not present

## 2023-12-31 DIAGNOSIS — M79604 Pain in right leg: Secondary | ICD-10-CM | POA: Diagnosis not present

## 2024-01-19 DIAGNOSIS — E119 Type 2 diabetes mellitus without complications: Secondary | ICD-10-CM | POA: Diagnosis not present

## 2024-01-26 DIAGNOSIS — M545 Low back pain, unspecified: Secondary | ICD-10-CM | POA: Diagnosis not present

## 2024-01-26 DIAGNOSIS — R262 Difficulty in walking, not elsewhere classified: Secondary | ICD-10-CM | POA: Diagnosis not present

## 2024-01-27 ENCOUNTER — Encounter: Payer: Self-pay | Admitting: Podiatry

## 2024-01-27 ENCOUNTER — Ambulatory Visit: Payer: Medicare Other | Admitting: Podiatry

## 2024-01-27 VITALS — Ht 67.0 in | Wt 250.0 lb

## 2024-01-27 DIAGNOSIS — M2011 Hallux valgus (acquired), right foot: Secondary | ICD-10-CM | POA: Diagnosis not present

## 2024-01-27 DIAGNOSIS — M79675 Pain in left toe(s): Secondary | ICD-10-CM | POA: Diagnosis not present

## 2024-01-27 DIAGNOSIS — S90415A Abrasion, left lesser toe(s), initial encounter: Secondary | ICD-10-CM | POA: Diagnosis not present

## 2024-01-27 DIAGNOSIS — B351 Tinea unguium: Secondary | ICD-10-CM

## 2024-01-27 DIAGNOSIS — M79674 Pain in right toe(s): Secondary | ICD-10-CM

## 2024-01-27 DIAGNOSIS — E119 Type 2 diabetes mellitus without complications: Secondary | ICD-10-CM

## 2024-01-27 DIAGNOSIS — M2012 Hallux valgus (acquired), left foot: Secondary | ICD-10-CM

## 2024-01-28 DIAGNOSIS — M545 Low back pain, unspecified: Secondary | ICD-10-CM | POA: Diagnosis not present

## 2024-01-28 DIAGNOSIS — R262 Difficulty in walking, not elsewhere classified: Secondary | ICD-10-CM | POA: Diagnosis not present

## 2024-01-31 NOTE — Progress Notes (Signed)
 ANNUAL DIABETIC FOOT EXAM  Subjective: [name] presents today for annual diabetic foot exam. Patient states he stubbed his left 2nd toe on a cement step. He has been applying TAO to digit daily. Chief Complaint  Patient presents with   Nail Problem    Pt is here for [facility] last A1C was 8.5 PCP is Dr Husain and LOV was in January.   Patient confirms h/o diabetes.  Patient denies any h/o foot wounds.  Husain, Karrar, MD is patient's PCP.  Past Medical History:  Diagnosis Date   Asthma    Colon polyp    Diabetes mellitus without complication (HCC)    Elevated LFTs    Fatty liver    Hypertension    Hypogonadism in male    Obesity    Rhinitis    Patient Active Problem List   Diagnosis Date Noted   Angina pectoris (HCC) 05/27/2023   Type 2 diabetes mellitus (HCC)    Body mass index (BMI) 36.0-36.9, adult 01/07/2023   Past Surgical History:  Procedure Laterality Date   CAROTID STENT     UMBILICAL HERNIA REPAIR     Current Outpatient Medications on File Prior to Visit  Medication Sig Dispense Refill   albuterol (PROVENTIL HFA;VENTOLIN HFA) 108 (90 BASE) MCG/ACT inhaler Inhale into the lungs every 6 (six) hours as needed for wheezing or shortness of breath.     aspirin 81 MG tablet Take 81 mg by mouth daily.     carvedilol (COREG) 12.5 MG tablet Take 12.5 mg by mouth 2 (two) times daily with a meal.     cholecalciferol (VITAMIN D3) 25 MCG (1000 UNIT) tablet 1 tablet Orally Once a day     glimepiride (AMARYL) 2 MG tablet Take 2 mg by mouth daily with breakfast.     glucose blood test strip 1 each by Other route as needed for other. Use as instructed     JARDIANCE 25 MG TABS tablet Take 25 mg by mouth daily.     loratadine (CLARITIN) 10 MG tablet Take 10 mg by mouth daily.     metFORMIN (GLUCOPHAGE) 1000 MG tablet Take 1,000 mg by mouth 2 (two) times daily with a meal.     nitroGLYCERIN (NITROSTAT) 0.4 MG SL tablet Place 0.4 mg under the tongue every 5 (five) minutes  as needed for chest pain.     ramipril (ALTACE) 5 MG capsule Take 5 mg by mouth daily.     No current facility-administered medications on file prior to visit.    Allergies  Allergen Reactions   Colesevelam     Other Reaction(s): GI upset   Crestor [Rosuvastatin]     Muscle ache   Dulaglutide     Other Reaction(s): gi intolerance   Plavix [Clopidogrel Bisulfate]     Questionable rash   Pravachol [Pravastatin]     myalgia   Semaglutide     Other Reaction(s): GI intol   Statins    Welchol [Colesevelam Hcl]     Gi upset    Zocor [Simvastatin]     Muscle ache   Social History   Occupational History   Not on file  Tobacco Use   Smoking status: Never   Smokeless tobacco: Not on file  Substance and Sexual Activity   Alcohol use: No   Drug use: No   Sexual activity: Not on file   Family History  Problem Relation Age of Onset   Heart disease Father    Colon cancer Father  Diabetes Mellitus I Father    CAD Father    Immunization History  Administered Date(s) Administered   PFIZER(Purple Top)SARS-COV-2 Vaccination 12/15/2019, 01/11/2020     Review of Systems: Negative except as noted in the HPI.   Objective: There were no vitals filed for this visit.  [name] is a pleasant 68 y.o. male in NAD. AAO X 3.  Diabetic foot exam was performed with the following findings:   Normal sensation of 10g monofilament Intact posterior tibialis and dorsalis pedis pulses Vascular Examination: Capillary refill time immediate b/l. Vascular status intact b/l with palpable pedal pulses. Pedal hair present b/l. No pain with calf compression b/l. Skin temperature gradient WNL b/l. No cyanosis or clubbing b/l. No ischemia or gangrene noted b/l.   Neurological Examination:Protective sensation decreased with 10 gram monofilament b/l. Vibratory sensation intact b/l.  Dermatological Examination: Pedal skin with normal turgor, texture and tone b/l.  No open wounds. No  interdigital macerations.   Toenails 1-5 b/l thick, discolored, elongated with subungual debris and pain on dorsal palpation.   Healing abrasion(s) with intact scab noted distal tip of left 2nd toe. No erythema, no edema, no drainage, no fluctuance.  Musculoskeletal Examination: Muscle strength 5/5 to all lower extremity muscle groups bilaterally. No pain, crepitus or joint limitation noted with ROM bilateral LE. No gross bony deformities bilaterally.  Radiographs: None     Lab Results  Component Value Date   HGBA1C (H) 11/04/2010    8.3 (NOTE)                                                                       According to the ADA Clinical Practice Recommendations for 2011, when HbA1c is used as a screening test:   >=6.5%   Diagnostic of Diabetes Mellitus           (if abnormal result  is confirmed)  5.7-6.4%   Increased risk of developing Diabetes Mellitus  References:Diagnosis and Classification of Diabetes Mellitus,Diabetes Care,2011,34(Suppl 1):S62-S69 and Standards of Medical Care in         Diabetes - 2011,Diabetes Care,2011,34  (Suppl 1):S11-S61. CORRECTED ON 01/17 AT 0119: PREVIOUSLY REPORTED AS 8.3 Reference range: <5.7   ADA Risk Categorization: Low Risk :  Patient has all of the following: Intact protective sensation No prior foot ulcer  No severe deformity Pedal pulses present  Assessment: 1. Pain due to onychomycosis of toenails of both feet   2. Abrasion of lesser toe of left foot, initial encounter   3. Hallux valgus, acquired, bilateral   4. Type 2 diabetes mellitus without complication, without long-term current use of insulin (HCC)   5. Encounter for diabetic foot exam (HCC)     Plan: Diabetic foot examination performed today.  All patient's and/or POA's questions/concerns addressed on today's visit. Mycotic toenails 1-5 debrided in length and girth without incident. Continue applying TAO to left 2nd digit once daily until healed. Continue daily foot  inspections and monitor blood glucose per PCP/Endocrinologist's recommendations. Continue soft, supportive shoe gear daily. Report any pedal injuries to medical professional. Call office if there are any questions/concerns. Return in about 3 months (around 04/27/2024).  Luella Sager, DPM      [facility] LOCATION: [address]  [place], [place] [address]                   Office [phone]   [facility] LOCATION: [address] [place], [place] [address] Office [phone]

## 2024-02-02 DIAGNOSIS — R262 Difficulty in walking, not elsewhere classified: Secondary | ICD-10-CM | POA: Diagnosis not present

## 2024-02-02 DIAGNOSIS — M545 Low back pain, unspecified: Secondary | ICD-10-CM | POA: Diagnosis not present

## 2024-02-04 DIAGNOSIS — M545 Low back pain, unspecified: Secondary | ICD-10-CM | POA: Diagnosis not present

## 2024-02-04 DIAGNOSIS — R262 Difficulty in walking, not elsewhere classified: Secondary | ICD-10-CM | POA: Diagnosis not present

## 2024-02-08 DIAGNOSIS — G459 Transient cerebral ischemic attack, unspecified: Secondary | ICD-10-CM | POA: Diagnosis not present

## 2024-02-08 DIAGNOSIS — I10 Essential (primary) hypertension: Secondary | ICD-10-CM | POA: Diagnosis not present

## 2024-02-08 DIAGNOSIS — E1165 Type 2 diabetes mellitus with hyperglycemia: Secondary | ICD-10-CM | POA: Diagnosis not present

## 2024-02-18 DIAGNOSIS — E119 Type 2 diabetes mellitus without complications: Secondary | ICD-10-CM | POA: Diagnosis not present

## 2024-02-22 DIAGNOSIS — I10 Essential (primary) hypertension: Secondary | ICD-10-CM | POA: Diagnosis not present

## 2024-02-22 DIAGNOSIS — E1165 Type 2 diabetes mellitus with hyperglycemia: Secondary | ICD-10-CM | POA: Diagnosis not present

## 2024-02-22 DIAGNOSIS — E1121 Type 2 diabetes mellitus with diabetic nephropathy: Secondary | ICD-10-CM | POA: Diagnosis not present

## 2024-03-07 DIAGNOSIS — G459 Transient cerebral ischemic attack, unspecified: Secondary | ICD-10-CM | POA: Diagnosis not present

## 2024-03-20 DIAGNOSIS — E119 Type 2 diabetes mellitus without complications: Secondary | ICD-10-CM | POA: Diagnosis not present

## 2024-03-22 DIAGNOSIS — E1165 Type 2 diabetes mellitus with hyperglycemia: Secondary | ICD-10-CM | POA: Diagnosis not present

## 2024-04-19 DIAGNOSIS — E119 Type 2 diabetes mellitus without complications: Secondary | ICD-10-CM | POA: Diagnosis not present

## 2024-04-28 DIAGNOSIS — K080 Exfoliation of teeth due to systemic causes: Secondary | ICD-10-CM | POA: Diagnosis not present

## 2024-05-10 ENCOUNTER — Ambulatory Visit: Admitting: Podiatry

## 2024-05-10 DIAGNOSIS — M79675 Pain in left toe(s): Secondary | ICD-10-CM | POA: Diagnosis not present

## 2024-05-10 DIAGNOSIS — B351 Tinea unguium: Secondary | ICD-10-CM

## 2024-05-10 DIAGNOSIS — E119 Type 2 diabetes mellitus without complications: Secondary | ICD-10-CM | POA: Diagnosis not present

## 2024-05-10 DIAGNOSIS — M79674 Pain in right toe(s): Secondary | ICD-10-CM

## 2024-05-10 DIAGNOSIS — R21 Rash and other nonspecific skin eruption: Secondary | ICD-10-CM

## 2024-05-10 MED ORDER — HYDROCORTISONE 1 % EX OINT: 1.0000 | TOPICAL_OINTMENT | Freq: Two times a day (BID) | CUTANEOUS | 1 refills | Status: AC

## 2024-05-10 NOTE — Patient Instructions (Signed)
 Rash, Adult  A rash is a breakout of spots or blotches on the skin. It can change the way your skin looks and feels. Many things can cause a rash. The goal of treatment is to stop the itching and keep the rash from spreading. Follow these instructions at home: Medicine Take or apply over-the-counter and prescription medicines only as told by your doctor. These may include medicines to treat: Red or swollen skin. Itching. An allergy. Pain. An infection.  Skin care Put a cool, wet cloth on the rash. Do not scratch or rub your skin. Try not to cover the rash. Keep it exposed to air as often as you can. Managing itching and discomfort Avoid hot showers or baths. These can make itching worse. A cold shower may help. Try taking a bath with: Epsom salts. You can get these at your pharmacy or grocery store. Follow the instructions on the package. Baking soda. Pour a small amount into the bath as told by your doctor. Colloidal oatmeal. You can get this at your pharmacy or grocery store. Follow the instructions on the package. Try putting baking soda paste on your skin. Stir water into baking soda until it gets like a paste. Try putting on a lotion to help with itching (calamine lotion). Keep cool. Stay out of the sun. Sweating and being hot can make itching worse. General instructions  Rest as needed. Drink enough fluid to keep your pee (urine) pale yellow. Wear loose-fitting clothes. Avoid scented soaps, detergents, and perfumes. Use gentle soaps, detergents, perfumes, and cosmetics. Avoid the things that cause your rash. Keep a journal to help keep track of what causes your rash. Write down: What you eat. What cosmetics you use. What you drink. What you wear. This includes jewelry. Contact a doctor if: You sweat a lot at night. You pee (urinate) more or less than normal. Your pee is a darker color than normal. Your eyes are sensitive to light. Your skin or the white parts of your  eyes turn yellow. Your skin tingles or is numb. You get painful blisters in your nose or mouth. Your rash does not go away after a few days, or it gets worse. You are more tired than normal. You are more thirsty than normal. You have new or worse symptoms. These may include: Pain in your belly. A fever. Watery poop (diarrhea). Vomiting. Weakness. Weight loss. Get help right away if: You start to feel mixed up (confused). You have a very bad headache or a stiff neck. You have very bad joint pain or stiffness. You get very sleepy or not responsive. You have a seizure. This information is not intended to replace advice given to you by your health care provider. Make sure you discuss any questions you have with your health care provider. Document Revised: 07/25/2022 Document Reviewed: 07/25/2022 Elsevier Patient Education  2024 [employer].

## 2024-05-15 ENCOUNTER — Encounter: Payer: Self-pay | Admitting: Podiatry

## 2024-05-15 NOTE — Progress Notes (Signed)
 Subjective:  Patient ID: [name], male    DOB: 1955-12-17,  MRN: [id number]  68 y.o. male presents preventative diabetic foot care and painful thick toenails that are difficult to trim. Pain interferes with ambulation. Aggravating factors include wearing enclosed shoe gear. Pain is relieved with periodic professional debridement.  New problem(s): Patient states he has a scaly area on his right ankle. He states he also attempted to trim his nails last night and the right great toe bled .  PCP is Ransom Other, MD , and last visit was Mar 07, 2024.  Allergies  Allergen Reactions   Colesevelam     Other Reaction(s): GI upset   Crestor [Rosuvastatin]     Muscle ache   Dulaglutide     Other Reaction(s): gi intolerance   Plavix [Clopidogrel Bisulfate]     Questionable rash   Pravachol [Pravastatin]     myalgia   Semaglutide     Other Reaction(s): GI intol   Statins    Welchol [Colesevelam Hcl]     Gi upset    Zocor [Simvastatin]     Muscle ache    Review of Systems: Negative except as noted in the HPI.   Objective:  GREGOR DERSHEM is a pleasant 68 y.o. male in NAD. AAO x 3.  Vascular Examination: Vascular status intact b/l with palpable pedal pulses. CFT immediate b/l. Pedal hair present. No edema. No pain with calf compression b/l. Skin temperature gradient WNL b/l. No varicosities noted. No cyanosis or clubbing noted.  Neurological Examination: Protective sensation decreased with 10 gram monofilament b/l.  Dermatological Examination: Skin eruption, annular, noted medial aspect right ankle with scaling. No interdigital macerations noted. Toenails 1-5 b/l thick, discolored, elongated with subungual debris and pain on dorsal palpation. No hyperkeratotic lesions noted b/l.   Evidence of right great toe with healing laceration distal tip of right great toe. No erythema, no edema, no drainage, no fluctuance.  Musculoskeletal Examination: Muscle strength 5/5 to  b/l LE.  No pain, crepitus noted b/l. No gross pedal deformities. Patient ambulates independently without assistive aids.   Radiographs: None   Assessment:   1. Pain due to onychomycosis of toenails of both feet   2. Rash/skin eruption   3. Type 2 diabetes mellitus without complication, without long-term current use of insulin (HCC)    Plan:   Meds ordered this encounter  Medications   hydrocortisone  1 % ointment    Sig: Apply 1 Application topically 2 (two) times daily. Apply to rash twice daily.    Dispense:  453.6 g    Refill:  1   Consent given for treatment. Patient examined. All patient's and/or POA's questions/concerns addressed on today's visit. He is to apply Neosporin to right great toe once daily. Toenails left great toe and 2-5 debrided in length and girth without incident. Continue foot and shoe inspections daily. Monitor blood glucose per PCP/Endocrinologist's recommendations. Continue soft, supportive shoe gear daily. Report any pedal injuries to medical professional. Call office if there are any questions/concerns. Discussed skin eruption right ankle. Rx sent for hydrocortisone  ointment.1% to be applied twice daily .  Return in about 3 months (around 08/10/2024).  Delon LITTIE Merlin, DPM      [facility] LOCATION: [address]  [place], [place] [address]                   Office [phone]   [facility] LOCATION: [address] [place], [place] [address] Office [phone]

## 2024-05-20 DIAGNOSIS — E119 Type 2 diabetes mellitus without complications: Secondary | ICD-10-CM | POA: Diagnosis not present

## 2024-05-24 DIAGNOSIS — Z1331 Encounter for screening for depression: Secondary | ICD-10-CM | POA: Diagnosis not present

## 2024-05-24 DIAGNOSIS — E1121 Type 2 diabetes mellitus with diabetic nephropathy: Secondary | ICD-10-CM | POA: Diagnosis not present

## 2024-05-24 DIAGNOSIS — G720 Drug-induced myopathy: Secondary | ICD-10-CM | POA: Diagnosis not present

## 2024-05-24 DIAGNOSIS — Z0000 Encounter for general adult medical examination without abnormal findings: Secondary | ICD-10-CM | POA: Diagnosis not present

## 2024-05-24 DIAGNOSIS — E1165 Type 2 diabetes mellitus with hyperglycemia: Secondary | ICD-10-CM | POA: Diagnosis not present

## 2024-05-24 DIAGNOSIS — Z125 Encounter for screening for malignant neoplasm of prostate: Secondary | ICD-10-CM | POA: Diagnosis not present

## 2024-05-24 DIAGNOSIS — E113293 Type 2 diabetes mellitus with mild nonproliferative diabetic retinopathy without macular edema, bilateral: Secondary | ICD-10-CM | POA: Diagnosis not present

## 2024-05-24 DIAGNOSIS — E782 Mixed hyperlipidemia: Secondary | ICD-10-CM | POA: Diagnosis not present

## 2024-05-24 DIAGNOSIS — I10 Essential (primary) hypertension: Secondary | ICD-10-CM | POA: Diagnosis not present

## 2024-06-20 DIAGNOSIS — E119 Type 2 diabetes mellitus without complications: Secondary | ICD-10-CM | POA: Diagnosis not present

## 2024-06-23 DIAGNOSIS — E1165 Type 2 diabetes mellitus with hyperglycemia: Secondary | ICD-10-CM | POA: Diagnosis not present

## 2024-07-20 DIAGNOSIS — E119 Type 2 diabetes mellitus without complications: Secondary | ICD-10-CM | POA: Diagnosis not present

## 2024-07-26 DIAGNOSIS — E1165 Type 2 diabetes mellitus with hyperglycemia: Secondary | ICD-10-CM | POA: Diagnosis not present

## 2024-07-26 DIAGNOSIS — E113293 Type 2 diabetes mellitus with mild nonproliferative diabetic retinopathy without macular edema, bilateral: Secondary | ICD-10-CM | POA: Diagnosis not present

## 2024-07-26 DIAGNOSIS — E1121 Type 2 diabetes mellitus with diabetic nephropathy: Secondary | ICD-10-CM | POA: Diagnosis not present

## 2024-08-20 DIAGNOSIS — E119 Type 2 diabetes mellitus without complications: Secondary | ICD-10-CM | POA: Diagnosis not present

## 2024-08-24 ENCOUNTER — Ambulatory Visit (INDEPENDENT_AMBULATORY_CARE_PROVIDER_SITE_OTHER): Admitting: Podiatry

## 2024-08-24 ENCOUNTER — Encounter: Payer: Self-pay | Admitting: Podiatry

## 2024-08-24 DIAGNOSIS — M79675 Pain in left toe(s): Secondary | ICD-10-CM

## 2024-08-24 DIAGNOSIS — E119 Type 2 diabetes mellitus without complications: Secondary | ICD-10-CM

## 2024-08-24 DIAGNOSIS — B351 Tinea unguium: Secondary | ICD-10-CM | POA: Diagnosis not present

## 2024-08-24 DIAGNOSIS — M79674 Pain in right toe(s): Secondary | ICD-10-CM | POA: Diagnosis not present

## 2024-08-31 ENCOUNTER — Encounter: Payer: Self-pay | Admitting: Podiatry

## 2024-08-31 NOTE — Progress Notes (Signed)
  Subjective:  Patient ID: [name], male    DOB: [birth year],  MRN: [id number]  [name] presents to clinic today for: preventative diabetic foot care for painful elongated mycotic toenails 1-5 bilaterally which are tender when wearing enclosed shoe gear. Pain is relieved with periodic professional debridement.  Chief Complaint  Patient presents with   Diabetes    [facility] NIDDM A1C 8.3 toenail trim. LOV with PCP 05/24/24.    PCP is Ransom Other, MD.  Allergies  Allergen Reactions   Colesevelam     Other Reaction(s): GI upset   Crestor [Rosuvastatin]     Muscle ache   Dulaglutide     Other Reaction(s): gi intolerance   Plavix [Clopidogrel Bisulfate]     Questionable rash   Pravachol [Pravastatin]     myalgia   Semaglutide     Other Reaction(s): GI intol   Statins    Welchol [Colesevelam Hcl]     Gi upset    Zocor [Simvastatin]     Muscle ache    Review of Systems: Negative except as noted in the HPI.  Objective: No changes noted in today's physical examination. There were no vitals filed for this visit.  [name] is a pleasant 68 y.o. male in NAD. AAO x 3.  Vascular Examination: Capillary refill time <3 seconds b/l LE. Palpable pedal pulses b/l LE. Digital hair present b/l. No pedal edema b/l. Skin temperature gradient WNL b/l. No varicosities b/l. No cyanosis or clubbing. No ischemia or gangrene. .  Dermatological Examination: Pedal skin with normal turgor, texture and tone b/l. No open wounds. No interdigital macerations b/l. Toenails 1-5 b/l thickened, discolored, dystrophic with subungual debris. There is pain on palpation to dorsal aspect of nailplates. No corns, calluses, nor porokeratotic lesions.  Neurological Examination: Protective sensation decreased with 10 gram monofilament b/l.  Musculoskeletal Examination: Muscle strength 5/5 to all lower extremity muscle groups bilaterally. No pain, crepitus or joint limitation noted with ROM  bilateral LE. Patient ambulates independent of any assistive aids.  Assessment/Plan: 1. Pain due to onychomycosis of toenails of both feet   2. Type 2 diabetes mellitus without complication, without long-term current use of insulin (HCC)    Patient was evaluated and treated. All patient's and/or POA's questions/concerns addressed on today's visit. Mycotic toenails 1-5 b/l debrided in length and girth without incident.  Continue daily foot inspections and monitor blood glucose per PCP/Endocrinologist's recommendations.Continue soft, supportive shoe gear daily. Report any pedal injuries to medical professional. Call office if there are any quesitons/concerns. -Patient/POA to call should there be question/concern in the interim.   Return in about 3 months (around 11/24/2024).  [name] Merlin, DPM      [facility] LOCATION: [address], [place] [address]                   Office [phone]   [facility] LOCATION: [address] [place], [place] [address] Office [phone]

## 2024-09-02 ENCOUNTER — Emergency Department (HOSPITAL_COMMUNITY)

## 2024-09-02 ENCOUNTER — Emergency Department (HOSPITAL_COMMUNITY): Admission: EM | Admit: 2024-09-02 | Discharge: 2024-09-02 | Disposition: A | Discharge status: Home / Self Care

## 2024-09-02 DIAGNOSIS — Z7982 Long term (current) use of aspirin: Secondary | ICD-10-CM | POA: Insufficient documentation

## 2024-09-02 DIAGNOSIS — Y9301 Activity, walking, marching and hiking: Secondary | ICD-10-CM | POA: Diagnosis not present

## 2024-09-02 DIAGNOSIS — I10 Essential (primary) hypertension: Secondary | ICD-10-CM | POA: Diagnosis not present

## 2024-09-02 DIAGNOSIS — K5730 Diverticulosis of large intestine without perforation or abscess without bleeding: Secondary | ICD-10-CM | POA: Diagnosis not present

## 2024-09-02 DIAGNOSIS — I6789 Other cerebrovascular disease: Secondary | ICD-10-CM | POA: Diagnosis not present

## 2024-09-02 DIAGNOSIS — W19XXXA Unspecified fall, initial encounter: Secondary | ICD-10-CM | POA: Diagnosis not present

## 2024-09-02 DIAGNOSIS — S2231XA Fracture of one rib, right side, initial encounter for closed fracture: Secondary | ICD-10-CM | POA: Insufficient documentation

## 2024-09-02 DIAGNOSIS — W109XXA Fall (on) (from) unspecified stairs and steps, initial encounter: Secondary | ICD-10-CM | POA: Diagnosis not present

## 2024-09-02 DIAGNOSIS — R911 Solitary pulmonary nodule: Secondary | ICD-10-CM | POA: Diagnosis not present

## 2024-09-02 DIAGNOSIS — Z7984 Long term (current) use of oral hypoglycemic drugs: Secondary | ICD-10-CM | POA: Insufficient documentation

## 2024-09-02 DIAGNOSIS — S0990XA Unspecified injury of head, initial encounter: Secondary | ICD-10-CM | POA: Diagnosis not present

## 2024-09-02 DIAGNOSIS — R0781 Pleurodynia: Secondary | ICD-10-CM | POA: Diagnosis not present

## 2024-09-02 DIAGNOSIS — M4802 Spinal stenosis, cervical region: Secondary | ICD-10-CM | POA: Diagnosis not present

## 2024-09-02 DIAGNOSIS — S199XXA Unspecified injury of neck, initial encounter: Secondary | ICD-10-CM | POA: Diagnosis not present

## 2024-09-02 DIAGNOSIS — E041 Nontoxic single thyroid nodule: Secondary | ICD-10-CM | POA: Diagnosis not present

## 2024-09-02 DIAGNOSIS — S2232XA Fracture of one rib, left side, initial encounter for closed fracture: Secondary | ICD-10-CM | POA: Diagnosis not present

## 2024-09-02 DIAGNOSIS — G9389 Other specified disorders of brain: Secondary | ICD-10-CM | POA: Diagnosis not present

## 2024-09-02 DIAGNOSIS — E049 Nontoxic goiter, unspecified: Secondary | ICD-10-CM | POA: Diagnosis not present

## 2024-09-02 DIAGNOSIS — S3991XA Unspecified injury of abdomen, initial encounter: Secondary | ICD-10-CM | POA: Diagnosis not present

## 2024-09-02 DIAGNOSIS — M47812 Spondylosis without myelopathy or radiculopathy, cervical region: Secondary | ICD-10-CM | POA: Diagnosis not present

## 2024-09-02 DIAGNOSIS — S3993XA Unspecified injury of pelvis, initial encounter: Secondary | ICD-10-CM | POA: Diagnosis not present

## 2024-09-02 LAB — CBC WITH DIFFERENTIAL/PLATELET
Abs Immature Granulocytes: 0.06 K/uL (ref 0.00–0.07)
Basophils Absolute: 0.1 K/uL (ref 0.0–0.1)
Basophils Relative: 1 %
Eosinophils Absolute: 0.5 K/uL (ref 0.0–0.5)
Eosinophils Relative: 4 %
HCT: 44.9 % (ref 39.0–52.0)
Hemoglobin: 15 g/dL (ref 13.0–17.0)
Immature Granulocytes: 1 %
Lymphocytes Relative: 13 %
Lymphs Abs: 1.7 K/uL (ref 0.7–4.0)
MCH: 30.2 pg (ref 26.0–34.0)
MCHC: 33.4 g/dL (ref 30.0–36.0)
MCV: 90.5 fL (ref 80.0–100.0)
Monocytes Absolute: 0.7 K/uL (ref 0.1–1.0)
Monocytes Relative: 6 %
Neutro Abs: 9.9 K/uL — ABNORMAL HIGH (ref 1.7–7.7)
Neutrophils Relative %: 75 %
Platelets: 248 K/uL (ref 150–400)
RBC: 4.96 MIL/uL (ref 4.22–5.81)
RDW: 13.4 % (ref 11.5–15.5)
WBC: 12.9 K/uL — ABNORMAL HIGH (ref 4.0–10.5)
nRBC: 0 % (ref 0.0–0.2)

## 2024-09-02 LAB — COMPREHENSIVE METABOLIC PANEL WITH GFR
ALT: 29 U/L (ref 0–44)
AST: 30 U/L (ref 15–41)
Albumin: 3.3 g/dL — ABNORMAL LOW (ref 3.5–5.0)
Alkaline Phosphatase: 44 U/L (ref 38–126)
Anion gap: 10 (ref 5–15)
BUN: 15 mg/dL (ref 8–23)
CO2: 20 mmol/L — ABNORMAL LOW (ref 22–32)
Calcium: 8 mg/dL — ABNORMAL LOW (ref 8.9–10.3)
Chloride: 108 mmol/L (ref 98–111)
Creatinine, Ser: 1.03 mg/dL (ref 0.61–1.24)
GFR, Estimated: >60 mL/min (ref >60)
Glucose, Bld: 205 mg/dL — ABNORMAL HIGH (ref 70–99)
Potassium: 4.3 mmol/L (ref 3.5–5.1)
Sodium: 138 mmol/L (ref 135–145)
Total Bilirubin: 1 mg/dL (ref 0.0–1.2)
Total Protein: 6.2 g/dL — ABNORMAL LOW (ref 6.5–8.1)

## 2024-09-02 MED ORDER — KETOROLAC TROMETHAMINE 15 MG/ML IJ SOLN
15.0000 mg | Freq: Once | INTRAMUSCULAR | Status: AC
  Administered 2024-09-02: 15 mg via INTRAVENOUS
  Filled 2024-09-02: qty 1

## 2024-09-02 MED ORDER — LIDOCAINE 5 % EX PTCH
1.0000 | MEDICATED_PATCH | CUTANEOUS | Status: DC
  Administered 2024-09-02: 1 via TRANSDERMAL
  Filled 2024-09-02: qty 1

## 2024-09-02 MED ORDER — LIDOCAINE 5 % EX PTCH: 1.0000 | MEDICATED_PATCH | CUTANEOUS | 0 refills | Status: DC

## 2024-09-02 MED ORDER — OXYCODONE-ACETAMINOPHEN 5-325 MG PO TABS
1.0000 | ORAL_TABLET | Freq: Once | ORAL | Status: AC
  Administered 2024-09-02: 1 via ORAL
  Filled 2024-09-02: qty 1

## 2024-09-02 MED ORDER — OXYCODONE HCL 5 MG PO TABS: 5.0000 mg | ORAL_TABLET | ORAL | 0 refills | Status: DC | PRN

## 2024-09-02 NOTE — ED Triage Notes (Signed)
  Slipped on stairs and fell. - LOC, no back pain, head or neck pain, - thinners

## 2024-09-02 NOTE — Discharge Instructions (Signed)
 IMPRESSION:  1. Nondisplaced left posterior 12th rib fracture. No pneumothorax.  2. No traumatic injury to the abdomen/pelvis.  3. Incidental 2.5 cm left thyroid  nodule; recommend non-emergent thyroid   ultrasound for further evaluation given patient age 70 years or greater.     For pain, you can take 1000 mg of Tylenol or 1 g of Tylenol every 6-8 hours.  Do not exceed more than 4000 mg or 4 g in a 24-hour period.  You can also take ibuprofen 400 to 600 mg every 6-8 hours as well.  Do not take this high-dose ibuprofen for greater than a week.  I will call in small amount of oxycodone. Do not take this and drive. Do not take with other sedatives.   Use the lidocaine patch.   Please use incentive spirometer 3-4 times daily to expand lungs to prevent pneumonia.   For your thyroid , please have this monitored by your PCP. They will repeat ultrasound outpatient to make sure no changes.

## 2024-09-02 NOTE — ED Notes (Signed)
 Pt educated on use of incentive spirometer. Pt verbalizes he Understands how to use and why

## 2024-09-02 NOTE — ED Provider Notes (Signed)
 [place] [facility]MENT AT [facility] Provider Note   CSN: [id number] Arrival date & time: 09/02/24  1949     Patient presents with: Fall (Slipped on stairs and fell. - LOC, no back pain, head or neck pain, - thinners)   [name] is a 68 y.o. male.    Fall Pertinent negatives include no chest pain, no abdominal pain and no shortness of breath.  Patient presents because of fall.  Was walking downstairs when he slipped and fell and landed on his left ribs.  Has since been having pain to the site.  Did not hit his head.  No loss of conscious.  Denies anticoagulation.  No cervical thoracic or lumbar pain endorses.  No bowel or bladder incontinence.  No saddle anesthesia.  No abdominal pain.  Patient states this is persistent on this left side his ribs.  Feels like he cannot take a deep breath because the pain after the fall.  Denies any chest pain or shortness of breath before or after the event     Previous medical history reviewed :    Prior to Admission medications   Medication Sig Start Date End Date Taking? Authorizing Provider  lidocaine (LIDODERM) 5 % Place 1 patch onto the skin daily. Remove & Discard patch within 12 hours or as directed by MD 09/02/24  Yes Simon Lavonia SAILOR, MD  oxyCODONE (ROXICODONE) 5 MG immediate release tablet Take 1 tablet (5 mg total) by mouth every 4 (four) hours as needed for severe pain (pain score 7-10). 09/02/24  Yes Simon Lavonia SAILOR, MD  albuterol (PROVENTIL HFA;VENTOLIN HFA) 108 (90 BASE) MCG/ACT inhaler Inhale into the lungs every 6 (six) hours as needed for wheezing or shortness of breath.    [provider]  aspirin 81 MG tablet Take 81 mg by mouth daily.    [provider]  carvedilol (COREG) 12.5 MG tablet Take 12.5 mg by mouth 2 (two) times daily with a meal.    [provider]  cholecalciferol (VITAMIN D3) 25 MCG (1000 UNIT) tablet 1 tablet Orally Once a day    [provider]   glimepiride (AMARYL) 2 MG tablet Take 2 mg by mouth daily with breakfast.    [provider]  glucose blood test strip 1 each by Other route as needed for other. Use as instructed    [provider]  hydrocortisone  1 % ointment Apply 1 Application topically 2 (two) times daily. Apply to rash twice daily. 05/10/24   Gaynel Delon CROME, DPM  JARDIANCE 25 MG TABS tablet Take 25 mg by mouth daily.    [provider]  loratadine (CLARITIN) 10 MG tablet Take 10 mg by mouth daily.    [provider]  metFORMIN (GLUCOPHAGE) 1000 MG tablet Take 1,000 mg by mouth 2 (two) times daily with a meal.    [provider]  nitroGLYCERIN (NITROSTAT) 0.4 MG SL tablet Place 0.4 mg under the tongue every 5 (five) minutes as needed for chest pain.    [provider]  ramipril (ALTACE) 5 MG capsule Take 5 mg by mouth daily.    [provider]  REPATHA 140 MG/ML SOSY Inject 1 mL into the skin every 14 (fourteen) days. 07/27/24   [provider]    Allergies: Colesevelam, Crestor [rosuvastatin], Dulaglutide, Plavix [clopidogrel bisulfate], Pravachol [pravastatin], Semaglutide, Statins, Welchol [colesevelam hcl], and Zocor [simvastatin]    Review of Systems  Constitutional:  Negative for chills and fever.  HENT:  Negative for ear pain and sore throat.   Eyes:  Negative for pain and visual disturbance.  Respiratory:  Negative for cough and shortness of breath.   Cardiovascular:  Negative for chest pain and palpitations.  Gastrointestinal:  Negative for abdominal pain and vomiting.  Genitourinary:  Negative for dysuria and hematuria.  Musculoskeletal:  Negative for arthralgias and back pain.  Skin:  Negative for color change and rash.  Neurological:  Negative for seizures and syncope.  All other systems reviewed and are negative.   Updated Vital Signs BP (!) 173/75 (BP Location: Right Arm)   Pulse 81   Temp 98 F (36.7 C) (Oral)   Resp 16    SpO2 96%   Physical Exam Vitals and nursing note reviewed.  Constitutional:      General: He is not in acute distress.    Appearance: He is well-developed.  HENT:     Head: Normocephalic and atraumatic.  Eyes:     Conjunctiva/sclera: Conjunctivae normal.  Cardiovascular:     Rate and Rhythm: Normal rate and regular rhythm.     Heart sounds: No murmur heard. Pulmonary:     Effort: Pulmonary effort is normal. No respiratory distress.     Breath sounds: Normal breath sounds.  Abdominal:     Palpations: Abdomen is soft.     Tenderness: There is no abdominal tenderness.  Musculoskeletal:        General: No swelling.       Arms:     Cervical back: Neck supple.  Skin:    General: Skin is warm and dry.     Capillary Refill: Capillary refill takes less than 2 seconds.  Neurological:     Mental Status: He is alert.  Psychiatric:        Mood and Affect: Mood normal.     (all labs ordered are listed, but only abnormal results are displayed) Labs Reviewed  CBC WITH DIFFERENTIAL/PLATELET - Abnormal; Notable for the following components:      Result Value   WBC 12.9 (*)    Neutro Abs 9.9 (*)    All other components within normal limits  COMPREHENSIVE METABOLIC PANEL WITH GFR - Abnormal; Notable for the following components:   CO2 20 (*)    Glucose, Bld 205 (*)    Calcium 8.0 (*)    Total Protein 6.2 (*)    Albumin 3.3 (*)    All other components within normal limits    EKG: None  Radiology: CT CHEST ABDOMEN PELVIS WO CONTRAST Result Date: 09/02/2024 EXAM: CT CHEST, ABDOMEN AND PELVIS WITHOUT CONTRAST 09/02/2024 09:36:00 PM TECHNIQUE: CT of the chest, abdomen and pelvis was performed without the administration of intravenous contrast. Multiplanar reformatted images are provided for review. Automated exposure control, iterative reconstruction, and/or weight based adjustment of the mA/kV was utilized to reduce the radiation dose to as low as reasonably achievable. COMPARISON:  None available. CLINICAL HISTORY: Blunt trauma. Left side. Eval for rib fracture/pneumo. FINDINGS: CHEST: MEDIASTINUM AND LYMPH NODES: Heart and pericardium are unremarkable. The central airways are clear. No mediastinal, hilar or axillary lymphadenopathy. Moderate 3 vessel coronary atherosclerosis. Mild thoracic aortic atherosclerosis. LUNGS AND PLEURA: No focal consolidation or pulmonary edema. No pleural effusion or pneumothorax. ABDOMEN AND PELVIS: LIVER: Mild hepatic steatosis. GALLBLADDER AND BILE DUCTS: Cholecystectomy. No biliary ductal dilatation. SPLEEN: No acute abnormality. PANCREAS: No acute abnormality. ADRENAL GLANDS: No acute abnormality. KIDNEYS, URETERS AND BLADDER: No stones in the kidneys or ureters. No hydronephrosis. No perinephric or periureteral  stranding. Urinary bladder is unremarkable. GI AND BOWEL: Stomach demonstrates no acute abnormality. There is no bowel obstruction. Sigmoid diverticulosis, without evidence of diverticulitis. REPRODUCTIVE ORGANS: The prostate is unremarkable. PERITONEUM AND RETROPERITONEUM: No ascites. No free air. Nonspecific jejunal mesenteric lymphatic stranding without lymphadenopathy. VASCULATURE: Aorta is normal in caliber. Atherosclerotic calcifications of the abdominal aorta and branch vessels. BONES AND SOFT TISSUES: Nondisplaced left posterior 12th rib fracture (image 140). Mild degenerative changes of the visualized thoracolumbar spine. 2.5 cm left thyroid  nodule. No focal soft tissue abnormality. IMPRESSION: 1. Nondisplaced left posterior 12th rib fracture. No pneumothorax. 2. No traumatic injury to the abdomen/pelvis. 3. Incidental 2.5 cm left thyroid  nodule; recommend non-emergent thyroid  ultrasound for further evaluation given patient age 30 years or greater. 4. Additional ancillary findings as above. Electronically signed by: Pinkie Pebbles MD 09/02/2024 09:51 PM EST RP Workstation: [id number]   CT Cervical Spine Wo Contrast Result Date:  09/02/2024 CLINICAL DATA:  Status post trauma. EXAM: CT CERVICAL SPINE WITHOUT CONTRAST TECHNIQUE: Multidetector CT imaging of the cervical spine was performed without intravenous contrast. Multiplanar CT image reconstructions were also generated. RADIATION DOSE REDUCTION: This exam was performed according to the departmental dose-optimization program which includes automated exposure control, adjustment of the mA and/or kV according to patient size and/or use of iterative reconstruction technique. COMPARISON:  None Available. FINDINGS: Alignment: Normal. Skull base and vertebrae: No acute fracture. Chronic and degenerative changes are seen involving the body and tip of the dens, as well as the adjacent portion of the anterior arch of C1. Soft tissues and spinal canal: No prevertebral fluid or swelling. No visible canal hematoma. Disc levels: There is mild anterior osteophyte formation at the level of C2-C3 with marked severity anterior osteophyte formation present at the levels of C4-C5, C5-C6 and C6-C7. Mild multilevel intervertebral disc space narrowing is seen throughout the cervical spine. Marked severity predominant right-sided multilevel facet joint hypertrophy is noted. Upper chest: Negative. Other: The left lobe of the thyroid  gland is enlarged and heterogeneous in appearance. IMPRESSION: 1. No acute fracture or subluxation in the cervical spine. 2. Multilevel degenerative changes, as described above. 3. Findings which may represent sequelae associated with a multinodular goiter. Correlation with nonemergent thyroid  ultrasound is recommended. Electronically Signed   By: Suzen Dials M.D.   On: 09/02/2024 21:45   CT Head Wo Contrast Result Date: 09/02/2024 CLINICAL DATA:  Status post trauma. EXAM: CT HEAD WITHOUT CONTRAST TECHNIQUE: Contiguous axial images were obtained from the base of the skull through the vertex without intravenous contrast. RADIATION DOSE REDUCTION: This exam was performed  according to the departmental dose-optimization program which includes automated exposure control, adjustment of the mA and/or kV according to patient size and/or use of iterative reconstruction technique. COMPARISON:  None Available. FINDINGS: Brain: There is generalized cerebral atrophy with widening of the extra-axial spaces and ventricular dilatation. There are areas of decreased attenuation within the white matter tracts of the supratentorial brain, consistent with microvascular disease changes. Vascular: No hyperdense vessel or unexpected calcification. Skull: Normal. Negative for fracture or focal lesion. Sinuses/Orbits: No acute finding. Other: None. IMPRESSION: 1. No acute intracranial abnormality. 2. Generalized cerebral atrophy and microvascular disease changes of the supratentorial brain. Electronically Signed   By: Suzen Dials M.D.   On: 09/02/2024 21:36     Procedures   Medications Ordered in the ED  lidocaine (LIDODERM) 5 % 1 patch (1 patch Transdermal Patch Applied 09/02/24 2210)  ketorolac (TORADOL) 15 MG/ML injection 15 mg (15 mg Intravenous Given 09/02/24  2210)  oxyCODONE-acetaminophen (PERCOCET/ROXICET) 5-325 MG per tablet 1 tablet (1 tablet Oral Given 09/02/24 2210)                                    Medical Decision Making Amount and/or Complexity of Data Reviewed Labs: ordered. Radiology: ordered.  Risk Prescription drug management.     HPI:    Patient presents because of fall.  Was walking downstairs when he slipped and fell and landed on his left ribs.  Has since been having pain to the site.  Did not hit his head.  No loss of conscious.  Denies anticoagulation.  No cervical thoracic or lumbar pain endorses.  No bowel or bladder incontinence.  No saddle anesthesia.  No abdominal pain.  Patient states this is persistent on this left side his ribs.  Feels like he cannot take a deep breath because the pain after the fall.  Denies any chest pain or shortness of  breath before or after the event     Previous medical history reviewed :   MDM:   Upon exam, patient ANO x 3 GCS 15.  No focal deficits.  Cranial nerves II to XII intact   Pain to palpation of lower left ribs.   No pain to palpation of cervical thoracic or lumbar spine.  Will obtain CT head and CT cervical spine given patient's age to make sure there is no evidence of subdural epidural or cervical fracture.  No indication to obtain CT imaging of thoracic or lumbar.  Will also obtain CT chest to rule any, pneumothorax and or rib fracture on the left side given significant pain.  Also obtain CBC CMP.  Also have some slight flank pain to palpation.  Also obtain CT abdomen pelvis without contrast to assess for any kind of obvious free fluid in the abdomen.   Reevaluation:   Upon reexamination, patient hemodynamically stable.  Remains A&O x 3 with GCS 15.   Patient has a posterior left rib fracture.  He is actively is hurting.  No other intra-abdominal pathology.  No intrathoracic pathology such as pneumothorax  CT head CT cervical spine benign.  No fracture.  Incidental thyroid  nodule was noted.  Discussed this with the patient.  Will follow-up with PCP for routine imaging outpatient.  Patient instructed to use lidocaine patch, Tylenol, ibuprofen as well as small dose of oxycodone in the setting of his rib fracture.  Discussed preventing pneumonia.  Incentive spirometer was given to him as well  Strict return precautions given   Interventions: oxycodone    I have independently interpreted the CT  images and agree with the radiologist finding   Social Determinant of Health: denies drugs/alcohol    Disposition and Follow Up: discharge       Final diagnoses:  Closed fracture of one rib of right side, initial encounter  Fall, initial encounter  Thyroid  nodule    ED Discharge Orders          Ordered    lidocaine (LIDODERM) 5 %  Every 24 hours        09/02/24 2222     oxyCODONE (ROXICODONE) 5 MG immediate release tablet  Every 4 hours PRN        09/02/24 2222               Simon Lavonia SAILOR, MD 09/02/24 2226

## 2024-10-16 ENCOUNTER — Encounter (HOSPITAL_COMMUNITY): Payer: Self-pay

## 2024-10-16 ENCOUNTER — Other Ambulatory Visit: Payer: Self-pay

## 2024-10-16 ENCOUNTER — Observation Stay (HOSPITAL_COMMUNITY)
Admission: EM | Admit: 2024-10-16 | Discharge: 2024-10-18 | Disposition: A | Attending: Emergency Medicine | Admitting: Emergency Medicine

## 2024-10-16 ENCOUNTER — Emergency Department (HOSPITAL_COMMUNITY)

## 2024-10-16 ENCOUNTER — Observation Stay (HOSPITAL_COMMUNITY)

## 2024-10-16 DIAGNOSIS — J90 Pleural effusion, not elsewhere classified: Secondary | ICD-10-CM | POA: Diagnosis not present

## 2024-10-16 DIAGNOSIS — R918 Other nonspecific abnormal finding of lung field: Secondary | ICD-10-CM | POA: Diagnosis not present

## 2024-10-16 DIAGNOSIS — R030 Elevated blood-pressure reading, without diagnosis of hypertension: Secondary | ICD-10-CM

## 2024-10-16 DIAGNOSIS — S2242XA Multiple fractures of ribs, left side, initial encounter for closed fracture: Secondary | ICD-10-CM | POA: Insufficient documentation

## 2024-10-16 DIAGNOSIS — Z9181 History of falling: Secondary | ICD-10-CM | POA: Diagnosis not present

## 2024-10-16 DIAGNOSIS — Z7982 Long term (current) use of aspirin: Secondary | ICD-10-CM | POA: Insufficient documentation

## 2024-10-16 DIAGNOSIS — E041 Nontoxic single thyroid nodule: Secondary | ICD-10-CM | POA: Insufficient documentation

## 2024-10-16 DIAGNOSIS — J948 Other specified pleural conditions: Secondary | ICD-10-CM | POA: Diagnosis not present

## 2024-10-16 DIAGNOSIS — W19XXXA Unspecified fall, initial encounter: Secondary | ICD-10-CM | POA: Diagnosis not present

## 2024-10-16 DIAGNOSIS — J9811 Atelectasis: Secondary | ICD-10-CM | POA: Insufficient documentation

## 2024-10-16 DIAGNOSIS — Z79899 Other long term (current) drug therapy: Secondary | ICD-10-CM | POA: Diagnosis not present

## 2024-10-16 DIAGNOSIS — I10 Essential (primary) hypertension: Secondary | ICD-10-CM | POA: Insufficient documentation

## 2024-10-16 DIAGNOSIS — J45909 Unspecified asthma, uncomplicated: Secondary | ICD-10-CM | POA: Diagnosis not present

## 2024-10-16 DIAGNOSIS — Z955 Presence of coronary angioplasty implant and graft: Secondary | ICD-10-CM | POA: Insufficient documentation

## 2024-10-16 DIAGNOSIS — W109XXA Fall (on) (from) unspecified stairs and steps, initial encounter: Secondary | ICD-10-CM | POA: Diagnosis not present

## 2024-10-16 DIAGNOSIS — R072 Precordial pain: Principal | ICD-10-CM

## 2024-10-16 DIAGNOSIS — E871 Hypo-osmolality and hyponatremia: Secondary | ICD-10-CM | POA: Diagnosis not present

## 2024-10-16 DIAGNOSIS — E119 Type 2 diabetes mellitus without complications: Secondary | ICD-10-CM | POA: Diagnosis not present

## 2024-10-16 DIAGNOSIS — R296 Repeated falls: Secondary | ICD-10-CM | POA: Insufficient documentation

## 2024-10-16 DIAGNOSIS — Z7984 Long term (current) use of oral hypoglycemic drugs: Secondary | ICD-10-CM | POA: Diagnosis not present

## 2024-10-16 DIAGNOSIS — R079 Chest pain, unspecified: Secondary | ICD-10-CM | POA: Diagnosis present

## 2024-10-16 DIAGNOSIS — R0602 Shortness of breath: Secondary | ICD-10-CM | POA: Diagnosis present

## 2024-10-16 DIAGNOSIS — Z794 Long term (current) use of insulin: Secondary | ICD-10-CM | POA: Diagnosis not present

## 2024-10-16 DIAGNOSIS — Z959 Presence of cardiac and vascular implant and graft, unspecified: Secondary | ICD-10-CM | POA: Diagnosis not present

## 2024-10-16 DIAGNOSIS — S2249XA Multiple fractures of ribs, unspecified side, initial encounter for closed fracture: Secondary | ICD-10-CM | POA: Insufficient documentation

## 2024-10-16 DIAGNOSIS — I2510 Atherosclerotic heart disease of native coronary artery without angina pectoris: Secondary | ICD-10-CM | POA: Insufficient documentation

## 2024-10-16 DIAGNOSIS — Z9861 Coronary angioplasty status: Secondary | ICD-10-CM | POA: Diagnosis not present

## 2024-10-16 LAB — BASIC METABOLIC PANEL WITH GFR
Anion gap: 16 — ABNORMAL HIGH (ref 5–15)
BUN: 13 mg/dL (ref 8–23)
CO2: 20 mmol/L — ABNORMAL LOW (ref 22–32)
Calcium: 9.6 mg/dL (ref 8.9–10.3)
Chloride: 98 mmol/L (ref 98–111)
Creatinine, Ser: 0.96 mg/dL (ref 0.61–1.24)
GFR, Estimated: >60 mL/min
Glucose, Bld: 167 mg/dL — ABNORMAL HIGH (ref 70–99)
Potassium: 5 mmol/L (ref 3.5–5.1)
Sodium: 134 mmol/L — ABNORMAL LOW (ref 135–145)

## 2024-10-16 LAB — CBC
HCT: 48.1 % (ref 39.0–52.0)
Hemoglobin: 15.5 g/dL (ref 13.0–17.0)
MCH: 29.9 pg (ref 26.0–34.0)
MCHC: 32.2 g/dL (ref 30.0–36.0)
MCV: 92.9 fL (ref 80.0–100.0)
Platelets: 343 K/uL (ref 150–400)
RBC: 5.18 MIL/uL (ref 4.22–5.81)
RDW: 13 % (ref 11.5–15.5)
WBC: 10.9 K/uL — ABNORMAL HIGH (ref 4.0–10.5)
nRBC: 0 % (ref 0.0–0.2)

## 2024-10-16 LAB — TROPONIN T, HIGH SENSITIVITY
Troponin T High Sensitivity: 16 ng/L (ref 0–19)
Troponin T High Sensitivity: 16 ng/L (ref 0–19)

## 2024-10-16 MED ORDER — RAMIPRIL 5 MG PO CAPS
5.0000 mg | ORAL_CAPSULE | Freq: Every day | ORAL | Status: DC
  Administered 2024-10-17 – 2024-10-18 (×2): 5 mg via ORAL

## 2024-10-16 MED ORDER — ASPIRIN 81 MG PO CHEW
324.0000 mg | CHEWABLE_TABLET | Freq: Once | ORAL | Status: AC
  Administered 2024-10-16: 324 mg via ORAL
  Filled 2024-10-16: qty 4

## 2024-10-16 MED ORDER — INSULIN ASPART 100 UNIT/ML IJ SOLN
0.0000 [IU] | Freq: Three times a day (TID) | INTRAMUSCULAR | Status: DC
  Administered 2024-10-17: 3 [IU] via SUBCUTANEOUS
  Administered 2024-10-17: 1 [IU] via SUBCUTANEOUS
  Administered 2024-10-17: 7 [IU] via SUBCUTANEOUS
  Administered 2024-10-18: 5 [IU] via SUBCUTANEOUS

## 2024-10-16 MED ORDER — IOHEXOL 350 MG/ML SOLN
75.0000 mL | Freq: Once | INTRAVENOUS | Status: AC | PRN
  Administered 2024-10-16: 75 mL via INTRAVENOUS

## 2024-10-16 MED ORDER — CARVEDILOL 12.5 MG PO TABS
12.5000 mg | ORAL_TABLET | Freq: Two times a day (BID) | ORAL | Status: DC
  Administered 2024-10-17 – 2024-10-18 (×3): 12.5 mg via ORAL

## 2024-10-16 MED ORDER — IOHEXOL 350 MG/ML SOLN: 75.0000 mL | Freq: Once | INTRAVENOUS | Status: DC | PRN

## 2024-10-16 MED ORDER — ACETAMINOPHEN 325 MG PO TABS: 650.0000 mg | ORAL_TABLET | Freq: Four times a day (QID) | ORAL | Status: DC | PRN

## 2024-10-16 MED ORDER — ACETAMINOPHEN 650 MG RE SUPP: 650.0000 mg | Freq: Four times a day (QID) | RECTAL | Status: DC | PRN

## 2024-10-16 NOTE — ED Notes (Signed)
 Patient reports chest pain last night. No chest pain today or this morning.

## 2024-10-16 NOTE — ED Provider Notes (Signed)
 " [place] EMERGENCY DEPARTMENT AT [facility] Provider Note   CSN: [id number] Arrival date & time: 10/16/24  9090     Patient presents with: Chest Pain   [name] is a 68 y.o. male.   Pt with hx cad c/o left chest pain in past couple days. Dull to sharp pain, worse w certain movements, positional changes, occasionally with deep breath. Unlike prior cardiac chest pain. At  rest. Mild sob/doe. No nv or diaphoresis. No heartburn. No chest wall injury or strain. Occasional non prod cough. Hx left lower rib fx from fall ~ 1 month prior. No leg pain or swelling. No hx dvt or pe. No fever or chills.   The history is provided by the patient, medical records and the spouse.  Chest Pain Associated symptoms: shortness of breath   Associated symptoms: no abdominal pain, no back pain, no fever, no headache, no nausea, no palpitations and no vomiting        Prior to Admission medications  Medication Sig Start Date End Date Taking? Authorizing Provider  albuterol (PROVENTIL HFA;VENTOLIN HFA) 108 (90 BASE) MCG/ACT inhaler Inhale into the lungs every 6 (six) hours as needed for wheezing or shortness of breath.    [provider]  aspirin  81 MG tablet Take 81 mg by mouth daily.    [provider]  carvedilol  (COREG ) 12.5 MG tablet Take 12.5 mg by mouth 2 (two) times daily with a meal.    [provider]  cholecalciferol (VITAMIN D3) 25 MCG (1000 UNIT) tablet 1 tablet Orally Once a day    [provider]  glimepiride (AMARYL) 2 MG tablet Take 2 mg by mouth daily with breakfast.    [provider]  glucose blood test strip 1 each by Other route as needed for other. Use as instructed    [provider]  hydrocortisone  1 % ointment Apply 1 Application topically 2 (two) times daily. Apply to rash twice daily. 05/10/24   Gaynel Delon CROME, DPM  JARDIANCE  25 MG TABS tablet Take 25 mg by mouth daily.    [provider]   lidocaine  (LIDODERM ) 5 % Place 1 patch onto the skin daily. Remove & Discard patch within 12 hours or as directed by MD 09/02/24   Simon Lavonia SAILOR, MD  loratadine (CLARITIN) 10 MG tablet Take 10 mg by mouth daily.    [provider]  metFORMIN (GLUCOPHAGE) 1000 MG tablet Take 1,000 mg by mouth 2 (two) times daily with a meal.    [provider]  nitroGLYCERIN (NITROSTAT) 0.4 MG SL tablet Place 0.4 mg under the tongue every 5 (five) minutes as needed for chest pain.    [provider]  oxyCODONE  (ROXICODONE ) 5 MG immediate release tablet Take 1 tablet (5 mg total) by mouth every 4 (four) hours as needed for severe pain (pain score 7-10). 09/02/24   Simon Lavonia SAILOR, MD  ramipril  (ALTACE ) 5 MG capsule Take 5 mg by mouth daily.    [provider]  REPATHA 140 MG/ML SOSY Inject 1 mL into the skin every 14 (fourteen) days. 07/27/24   [provider]    Allergies: Colesevelam, Crestor [rosuvastatin], Dulaglutide, Plavix [clopidogrel bisulfate], Pravachol [pravastatin], Semaglutide, Statins, Welchol [colesevelam hcl], and Zocor [simvastatin]    Review of Systems  Constitutional:  Negative for chills and fever.  HENT:  Negative for sore throat.   Respiratory:  Positive for shortness of breath.   Cardiovascular:  Positive for chest pain. Negative  for palpitations and leg swelling.  Gastrointestinal:  Negative for abdominal pain, nausea and vomiting.  Genitourinary:  Negative for flank pain.  Musculoskeletal:  Negative for back pain and neck pain.  Skin:  Negative for rash.  Neurological:  Negative for headaches.    Updated Vital Signs BP (!) 172/82 (BP Location: Right Arm)   Pulse (!) 103   Temp 98.3 F (36.8 C) (Oral)   Resp (!) 22   Ht 1.702 m (5' 7)   Wt 113.4 kg   SpO2 97%   BMI 39.16 kg/m   Physical Exam Vitals and nursing note reviewed.  Constitutional:      Appearance: Normal appearance. He is well-developed.  HENT:     Head:  Atraumatic.     Nose: Nose normal.     Mouth/Throat:     Mouth: Mucous membranes are moist.  Eyes:     General: No scleral icterus.    Conjunctiva/sclera: Conjunctivae normal.  Neck:     Trachea: No tracheal deviation.  Cardiovascular:     Rate and Rhythm: Normal rate and regular rhythm.     Pulses: Normal pulses.     Heart sounds: Normal heart sounds. No murmur heard.    No friction rub. No gallop.  Pulmonary:     Effort: Pulmonary effort is normal. No accessory muscle usage or respiratory distress.     Breath sounds: Normal breath sounds.  Chest:     Chest wall: No tenderness.  Abdominal:     General: Bowel sounds are normal. There is no distension.     Palpations: Abdomen is soft.     Tenderness: There is no abdominal tenderness.  Genitourinary:    Comments: No cva tenderness. Musculoskeletal:        General: No swelling or tenderness.     Cervical back: Normal range of motion and neck supple. No rigidity.     Right lower leg: No edema.     Left lower leg: No edema.  Skin:    General: Skin is warm and dry.     Findings: No rash.  Neurological:     Mental Status: He is alert.     Comments: Alert, speech clear.   Psychiatric:        Mood and Affect: Mood normal.     (all labs ordered are listed, but only abnormal results are displayed) Results for orders placed or performed during the hospital encounter of 10/16/24  Basic metabolic panel   Collection Time: 10/16/24 10:20 AM  Result Value Ref Range   Sodium 134 (L) 135 - 145 mmol/L   Potassium 5.0 3.5 - 5.1 mmol/L   Chloride 98 98 - 111 mmol/L   CO2 20 (L) 22 - 32 mmol/L   Glucose, Bld 167 (H) 70 - 99 mg/dL   BUN 13 8 - 23 mg/dL   Creatinine, Ser 9.03 0.61 - 1.24 mg/dL   Calcium 9.6 8.9 - 89.6 mg/dL   GFR, Estimated >39 >39 mL/min   Anion gap 16 (H) 5 - 15  CBC   Collection Time: 10/16/24 10:20 AM  Result Value Ref Range   WBC 10.9 (H) 4.0 - 10.5 K/uL   RBC 5.18 4.22 - 5.81 MIL/uL   Hemoglobin 15.5 13.0 -  17.0 g/dL   HCT 51.8 60.9 - 47.9 %   MCV 92.9 80.0 - 100.0 fL   MCH 29.9 26.0 - 34.0 pg   MCHC 32.2 30.0 - 36.0 g/dL   RDW 86.9 88.4 - 84.4 %  Platelets 343 150 - 400 K/uL   nRBC 0.0 0.0 - 0.2 %  Troponin T, High Sensitivity   Collection Time: 10/16/24 10:20 AM  Result Value Ref Range   Troponin T High Sensitivity 16 0 - 19 ng/L  Troponin T, High Sensitivity   Collection Time: 10/16/24  3:43 PM  Result Value Ref Range   Troponin T High Sensitivity 16 0 - 19 ng/L   DG Chest 2 View Result Date: 10/16/2024 EXAM: 2 VIEW(S) XRAY OF THE CHEST 10/16/2024 10:49:00 AM COMPARISON: 03/06/2011  . CLINICAL HISTORY: chest pain FINDINGS: LUNGS AND PLEURA: Left lung base atelectasis/opacity. Moderate left pleural effusion. No pneumothorax. HEART AND MEDIASTINUM: No acute abnormality of the cardiac and mediastinal silhouettes. BONES AND SOFT TISSUES: No acute osseous abnormality. IMPRESSION: 1. Moderate left pleural effusion. 2. Left lung base atelectasis/opacity. Electronically signed by: Waddell Calk MD 10/16/2024 12:14 PM EST RP Workstation: [id number]    EKG: EKG Interpretation Date/Time:  Sunday October 16 2024 09:15:33 EST Ventricular Rate:  100 PR Interval:  166 QRS Duration:  132 QT Interval:  376 QTC Calculation: 485 R Axis:   142  Text Interpretation: Normal sinus rhythm Right bundle branch block Non-specific ST-t changes Confirmed by Bernard Drivers ([id number]) on 10/16/2024 5:13:04 PM  Radiology: DG Chest 2 View Result Date: 10/16/2024 EXAM: 2 VIEW(S) XRAY OF THE CHEST 10/16/2024 10:49:00 AM COMPARISON: 03/06/2011  . CLINICAL HISTORY: chest pain FINDINGS: LUNGS AND PLEURA: Left lung base atelectasis/opacity. Moderate left pleural effusion. No pneumothorax. HEART AND MEDIASTINUM: No acute abnormality of the cardiac and mediastinal silhouettes. BONES AND SOFT TISSUES: No acute osseous abnormality. IMPRESSION: 1. Moderate left pleural effusion. 2. Left lung base atelectasis/opacity.  Electronically signed by: Waddell Calk MD 10/16/2024 12:14 PM EST RP Workstation: [id number]     Procedures   Medications Ordered in the ED  iohexol  (OMNIPAQUE ) 350 MG/ML injection 75 mL (has no administration in time range)  aspirin  chewable tablet 324 mg (324 mg Oral Given 10/16/24 1024)  iohexol  (OMNIPAQUE ) 350 MG/ML injection 75 mL (75 mLs Intravenous Contrast Given 10/16/24 2039)                                    Medical Decision Making Problems Addressed: Elevated blood pressure reading: acute illness or injury Essential hypertension: chronic illness or injury with exacerbation, progression, or side effects of treatment that poses a threat to life or bodily functions Pleural effusion, left: acute illness or injury with systemic symptoms that poses a threat to life or bodily functions Precordial chest pain: acute illness or injury with systemic symptoms that poses a threat to life or bodily functions  Amount and/or Complexity of Data Reviewed Independent Historian: spouse    Details: hx External Data Reviewed: notes. Labs: ordered. Decision-making details documented in ED Course. Radiology: ordered and independent interpretation performed. Decision-making details documented in ED Course. ECG/medicine tests: ordered and independent interpretation performed. Decision-making details documented in ED Course.  Risk Prescription drug management. Decision regarding hospitalization.   Iv ns. Continuous pulse ox and cardiac monitoring. Labs ordered/sent. Imaging ordered.   Differential diagnosis includes acs, msk cp, pna, effusion, hemothorax, PE, etc. Dispo decision including potential need for admission considered - will get labs and imaging and reassess.   Reviewed nursing notes and prior charts for additional history. External reports reviewed. Additional history from: spouse.   Cardiac monitor: sinus rhythm, rate 90.  Labs reviewed/interpreted by me - wbc  11, hgb 15.  Chem largely unremarkable. Trop x 2 normal and not increasing - after prolonged symptoms, felt not c/w acs.   Xrays reviewed/interpreted by me - left pleural effusion. Prior rib fx noted in chart. As new pleuritic pain, sob, will get cta, r/o PE, r/o pna, r/o mass r/o hemothorax.   CT reviewed/interpreted by me - pnd.  2108 cta pending - signed out to EDP, Dr Francesca, to check CTA when resulted, and dispo appropriately.         Final diagnoses:  Precordial chest pain  Pleural effusion, left  Elevated blood pressure reading  Essential hypertension    ED Discharge Orders     None          Bernard Drivers, MD 10/16/24 2109  "

## 2024-10-16 NOTE — ED Provider Triage Note (Signed)
 Emergency Medicine Provider Triage Evaluation Note  [name] , a 68 y.o. male  was evaluated in triage.  Pt complains of intermittent chest pain over the past couple days. Occurs when laying down and watching TV. Endorses rhinorrhea and chest congestion. Denies cough, SOB. Denies leg swelling.   Review of Systems  Positive:  Negative:   Physical Exam  BP (!) 156/89 (BP Location: Left Arm)   Pulse (!) 102   Temp 97.8 F (36.6 C) (Oral)   Resp 18   Ht 5' 7 (1.702 m)   Wt 113.4 kg   SpO2 97%   BMI 39.16 kg/m  Gen:   Awake, no distress   Resp:  Normal effort  MSK:   Moves extremities without difficulty  Other:    Medical Decision Making  Medically screening exam initiated at 10:17 AM.  Appropriate orders placed.  [name] was informed that the remainder of the evaluation will be completed by another provider, this initial triage assessment does not replace that evaluation, and the importance of remaining in the ED until their evaluation is complete.     [name] F, [place] 10/16/24 1019

## 2024-10-16 NOTE — ED Triage Notes (Signed)
 Patient has mutliple complains of chest pain last night, off balance for awhile and losing bladder control recently.

## 2024-10-16 NOTE — ED Provider Notes (Signed)
" ° °  ED Course / MDM   Clinical Course as of 10/16/24 2236  [name] Oct 16, 2024  2116 Received sign out from Dr. Bernard pending CTA chest.  [WS]  2235 CTA shows loculated effusion.  Suspect this is probably related to his previous rib fracture but no new trauma in the past month.  He is not on any anticoagulant.  He reports that he is very symptomatic of this whenever he gets up and walks around..  Given his age and symptoms feel the best that he come in for at least observation to have thoracentesis and further evaluation.  Discussed with Dr. Franky who will see patient [WS]    Clinical Course User Index [WS] Francesca Elsie CROME, MD   Medical Decision Making Amount and/or Complexity of Data Reviewed Radiology: ordered.  Risk Prescription drug management.         Francesca Elsie CROME, MD 10/16/24 2236  "

## 2024-10-16 NOTE — H&P (Incomplete)
 " History and Physical    [name] FMW:[id number] DOB: [birth year] DOA: 10/16/2024  Patient coming from: Home.  Chief Complaint: Left-sided chest pain.  HPI: [name] is a 68 y.o. male with history of CAD status post PCI, hypertension, diabetes mellitus type 2 presents to the ER because of ongoing left-sided chest pain for the last few weeks which has progressively been worsening with some shortness of breath.  Patient had come to the ER on September 02, 2024 after he sustained a fall when he hit the left side of his chest.  Patient stated at the time he lost balance and fell on the stairs.  CT scan done at that time showed left sided 12th rib fracture.  Denies any further falls but has been in persistent imbalance.  Due to worsening left-sided chest pain patient presents to the ER.  ED Course: In the ER patient was afebrile and not hypoxic.  CT scan done shows large left pleural effusion with acute fractures of eighth ninth 10th rib and subacute fracture of 12th rib on the left side.  Patient admitted for further management.  Labs showed WBC of 10.9 troponins were negative at 16 EKG shows normal sinus rhythm sodium 134 glucose 167.  Review of Systems: As per HPI, rest all negative.   Past Medical History:  Diagnosis Date   Asthma    Colon polyp    Diabetes mellitus without complication (HCC)    Elevated LFTs    Fatty liver    Hypertension    Hypogonadism in male    Obesity    Rhinitis     Past Surgical History:  Procedure Laterality Date   CARDIAC CATHETERIZATION     CAROTID STENT     CORONARY ANGIOPLASTY     UMBILICAL HERNIA REPAIR       reports that he has never smoked. He does not have any smokeless tobacco history on file. He reports that he does not drink alcohol and does not use drugs.  Allergies[1]  Family History  Problem Relation Age of Onset   Heart disease Father    Colon cancer Father    Diabetes Mellitus I Father    CAD Father     Prior  to Admission medications  Medication Sig Start Date End Date Taking? Authorizing Provider  albuterol (PROVENTIL HFA;VENTOLIN HFA) 108 (90 BASE) MCG/ACT inhaler Inhale into the lungs every 6 (six) hours as needed for wheezing or shortness of breath.    [provider]  aspirin  81 MG tablet Take 81 mg by mouth daily.    [provider]  carvedilol  (COREG ) 12.5 MG tablet Take 12.5 mg by mouth 2 (two) times daily with a meal.    [provider]  cholecalciferol (VITAMIN D3) 25 MCG (1000 UNIT) tablet 1 tablet Orally Once a day    [provider]  glimepiride (AMARYL) 2 MG tablet Take 2 mg by mouth daily with breakfast.    [provider]  glucose blood test strip 1 each by Other route as needed for other. Use as instructed    [provider]  hydrocortisone  1 % ointment Apply 1 Application topically 2 (two) times daily. Apply to rash twice daily. 05/10/24   Gaynel Delon CROME, DPM  JARDIANCE  25 MG TABS tablet Take 25 mg by mouth daily.    [provider]  lidocaine  (LIDODERM ) 5 % Place 1 patch onto the skin daily. Remove & Discard patch within 12 hours or as directed by  MD 09/02/24   Simon Lavonia SAILOR, MD  loratadine (CLARITIN) 10 MG tablet Take 10 mg by mouth daily.    [provider]  metFORMIN (GLUCOPHAGE) 1000 MG tablet Take 1,000 mg by mouth 2 (two) times daily with a meal.    [provider]  nitroGLYCERIN (NITROSTAT) 0.4 MG SL tablet Place 0.4 mg under the tongue every 5 (five) minutes as needed for chest pain.    [provider]  oxyCODONE  (ROXICODONE ) 5 MG immediate release tablet Take 1 tablet (5 mg total) by mouth every 4 (four) hours as needed for severe pain (pain score 7-10). 09/02/24   Simon Lavonia SAILOR, MD  ramipril  (ALTACE ) 5 MG capsule Take 5 mg by mouth daily.    [provider]  REPATHA 140 MG/ML SOSY Inject 1 mL into the skin every 14 (fourteen) days. 07/27/24   [provider]     Physical Exam: Constitutional: Moderately built and nourished. Vitals:   10/16/24 1930 10/16/24 1945 10/16/24 2015 10/16/24 2030  BP: (!) 165/89 (!) 174/92 (!) 162/85 (!) 138/119  Pulse: (!) 102 (!) 102 (!) 104 (!) 102  Resp: 20  (!) 21 16  Temp:      TempSrc:      SpO2: 97% 95% 97% 96%  Weight:      Height:       Eyes: Anicteric no pallor. ENMT: No discharge from the ears eyes nose or mouth. Neck: No mass felt.  No neck rigidity. Respiratory: No rhonchi or crepitations. Cardiovascular: S1-S2 heard. Abdomen: Soft nontender bowel sound present. Musculoskeletal: No edema.   Skin: No rash. Neurologic: Alert awake oriented time place and person.  Moves all extremities. Psychiatric: Appears normal.  Normal affect.   Labs on Admission: I have personally reviewed following labs and imaging studies  CBC: Recent Labs  Lab 10/16/24 1020  WBC 10.9*  HGB 15.5  HCT 48.1  MCV 92.9  PLT 343   Basic Metabolic Panel: Recent Labs  Lab 10/16/24 1020  NA 134*  K 5.0  CL 98  CO2 20*  GLUCOSE 167*  BUN 13  CREATININE 0.96  CALCIUM 9.6   GFR: Estimated Creatinine Clearance: 86.1 mL/min (by C-G formula based on SCr of 0.96 mg/dL). Liver Function Tests: No results for input(s): AST, ALT, ALKPHOS, BILITOT, PROT, ALBUMIN in the last 168 hours. No results for input(s): LIPASE, AMYLASE in the last 168 hours. No results for input(s): AMMONIA in the last 168 hours. Coagulation Profile: No results for input(s): INR, PROTIME in the last 168 hours. Cardiac Enzymes: No results for input(s): CKTOTAL, CKMB, CKMBINDEX, TROPONINI in the last 168 hours. BNP (last 3 results) No results for input(s): PROBNP in the last 8760 hours. HbA1C: No results for input(s): HGBA1C in the last 72 hours. CBG: No results for input(s): GLUCAP in the last 168 hours. Lipid Profile: No results for input(s): CHOL, HDL, LDLCALC, TRIG, CHOLHDL, LDLDIRECT in  the last 72 hours. Thyroid  Function Tests: No results for input(s): TSH, T4TOTAL, FREET4, T3FREE, THYROIDAB in the last 72 hours. Anemia Panel: No results for input(s): VITAMINB12, FOLATE, FERRITIN, TIBC, IRON, RETICCTPCT in the last 72 hours. Urine analysis:    Component Value Date/Time   COLORURINE YELLOW 11/23/2010 0138   APPEARANCEUR CLEAR 11/23/2010 0138   LABSPEC 1.008 11/23/2010 0138   PHURINE 7.0 11/23/2010 0138   HGBUR NEGATIVE 11/23/2010 0138   BILIRUBINUR NEGATIVE 11/23/2010 0138   KETONESUR NEGATIVE 11/23/2010 0138   PROTEINUR NEGATIVE 11/23/2010 0138   UROBILINOGEN 0.2 11/23/2010  0138   NITRITE NEGATIVE 11/23/2010 0138   LEUKOCYTESUR  11/23/2010 0138    NEGATIVE MICROSCOPIC NOT DONE ON URINES WITH NEGATIVE PROTEIN, BLOOD, LEUKOCYTES, NITRITE, OR GLUCOSE <1000 mg/dL.   Sepsis Labs: @LABRCNTIP (procalcitonin:4,lacticidven:4) )No results found for this or any previous visit (from the past 240 hours).   Radiological Exams on Admission: CT Angio Chest PE W/Cm &/Or Wo Cm Result Date: 10/16/2024 EXAM: CTA CHEST 10/16/2024 08:41:29 PM TECHNIQUE: CTA of the chest was performed without and with the administration of 75 mL of intravenous contrast (iohexol  (OMNIPAQUE ) 350 MG/ML injection 75 mL IOHEXOL  350 MG/ML SOLN). Multiplanar reformatted images are provided for review. MIP images are provided for review. Automated exposure control, iterative reconstruction, and/or weight based adjustment of the mA/kV was utilized to reduce the radiation dose to as low as reasonably achievable. COMPARISON: CT chest abdomen and pelvis 09/02/2024. CLINICAL HISTORY: Pulmonary embolism (PE) suspected, high prob; left pleural effusion. r/o PE. r/o pna. r/o mass. (hx rib fx 1 month ago) FINDINGS: PULMONARY ARTERIES: Pulmonary arteries are adequately opacified for evaluation. No acute pulmonary embolus. Main pulmonary artery is normal in caliber. MEDIASTINUM: 2.5 cm left thyroid  nodule  present. The heart and pericardium demonstrate no acute abnormality. There is no acute abnormality of the thoracic aorta. LYMPH NODES: No mediastinal, hilar or axillary lymphadenopathy. LUNGS AND PLEURA: There is a new large left pleural effusion, partially loculated superiorly. There is compressive atelectasis of the left lower lobe. There is also a band of atelectasis in the left upper lobe. UPPER ABDOMEN: Limited images of the upper abdomen are unremarkable. SOFT TISSUES AND BONES: Subacute lateral left 7th rib fracture present, which is new from prior. There is an acute mildly displaced lateral left 8th rib fracture and an acute appearing lateral 9th rib fracture. There is an acute displaced posterior left 10th rib fracture. There is a healing subacute posterior left 12th rib fracture. IMPRESSION: 1. No pulmonary embolism. 2. New large left pleural effusion, partially loculated superiorly, with associated compressive atelectasis of the left lower lobe and band of atelectasis in the left upper lobe. 3. Multiple left rib fractures, acute mildly displaced lateral 8th, acute lateral 9th, acute displaced posterior 10th, and healing subacute posterior 12th. There is also a new subacute lateral 7th, 4. Left thyroid  nodule measuring 2.5 cm, recommend non-emergent thyroid  ultrasound. Electronically signed by: Greig Pique MD 10/16/2024 09:24 PM EST RP Workstation: [id number]   DG Chest 2 View Result Date: 10/16/2024 EXAM: 2 VIEW(S) XRAY OF THE CHEST 10/16/2024 10:49:00 AM COMPARISON: 03/06/2011  . CLINICAL HISTORY: chest pain FINDINGS: LUNGS AND PLEURA: Left lung base atelectasis/opacity. Moderate left pleural effusion. No pneumothorax. HEART AND MEDIASTINUM: No acute abnormality of the cardiac and mediastinal silhouettes. BONES AND SOFT TISSUES: No acute osseous abnormality. IMPRESSION: 1. Moderate left pleural effusion. 2. Left lung base atelectasis/opacity. Electronically signed by: Waddell Calk MD 10/16/2024  12:14 PM EST RP Workstation: [id number]    EKG: Independently reviewed.  Normal sinus rhythm RBBB.  Assessment/Plan Principal Problem:   Pleural effusion Active Problems:   Type 2 diabetes mellitus (HCC)   CAD S/P percutaneous coronary angioplasty   Hyponatremia   Multiple rib fractures   Essential hypertension    Large left pleural effusion with loculation and also acute fractures of the left sided eighth, ninth, 10th and subacute fractures 12th ribs -    I discussed with on-call trauma surgeon Dr. Mitzie Freund who at this time requested getting thoracentesis and further plan based on it.  Since patient also has  loculation will keep patient on empiric antibiotics until we get pleural fluid studies. Dizziness appears to be ongoing for last 2 to 3 months.  MRI brain is pending.  Will get physical therapy consult.  Check orthostatics. CAD troponins are negative.  Will continue with beta-blockers and patient takes Repatha. Hypertension on carvedilol  and ramipril . Diabetes mellitus type 2 on glimepiride and metformin and Jardiance .  Check hemoglobin A1c.  Presently on sliding scale coverage. Thyroid  nodule seen on the CAT scan will need further workup as outpatient.  DVT prophylaxis: SCDs. Code Status: Full code. Family Communication: Patient's wife. Disposition Plan: Medical floor. Consults called: Discussed with trauma surgeon. Admission status: Observation.         [1]  Allergies Allergen Reactions   Colesevelam     Other Reaction(s): GI upset   Crestor [Rosuvastatin]     Muscle ache   Dulaglutide     Other Reaction(s): gi intolerance   Plavix [Clopidogrel Bisulfate]     Questionable rash   Pravachol [Pravastatin]     myalgia   Semaglutide     Other Reaction(s): GI intol   Statins    Welchol [Colesevelam Hcl]     Gi upset    Zocor [Simvastatin]     Muscle ache   "

## 2024-10-17 ENCOUNTER — Observation Stay (HOSPITAL_COMMUNITY)

## 2024-10-17 DIAGNOSIS — S2242XA Multiple fractures of ribs, left side, initial encounter for closed fracture: Secondary | ICD-10-CM | POA: Diagnosis not present

## 2024-10-17 DIAGNOSIS — E041 Nontoxic single thyroid nodule: Secondary | ICD-10-CM

## 2024-10-17 DIAGNOSIS — E871 Hypo-osmolality and hyponatremia: Secondary | ICD-10-CM | POA: Diagnosis not present

## 2024-10-17 DIAGNOSIS — I10 Essential (primary) hypertension: Secondary | ICD-10-CM

## 2024-10-17 DIAGNOSIS — I2510 Atherosclerotic heart disease of native coronary artery without angina pectoris: Secondary | ICD-10-CM | POA: Diagnosis not present

## 2024-10-17 DIAGNOSIS — R42 Dizziness and giddiness: Secondary | ICD-10-CM | POA: Insufficient documentation

## 2024-10-17 DIAGNOSIS — J90 Pleural effusion, not elsewhere classified: Secondary | ICD-10-CM | POA: Diagnosis not present

## 2024-10-17 DIAGNOSIS — E119 Type 2 diabetes mellitus without complications: Secondary | ICD-10-CM | POA: Diagnosis not present

## 2024-10-17 DIAGNOSIS — Z9861 Coronary angioplasty status: Secondary | ICD-10-CM | POA: Diagnosis not present

## 2024-10-17 HISTORY — PX: IR THORACENTESIS ASP PLEURAL SPACE W/IMG GUIDE: IMG5380

## 2024-10-17 LAB — CBG MONITORING, ED
Glucose-Capillary: 149 mg/dL — ABNORMAL HIGH (ref 70–99)
Glucose-Capillary: 237 mg/dL — ABNORMAL HIGH (ref 70–99)

## 2024-10-17 LAB — BODY FLUID CELL COUNT WITH DIFFERENTIAL
Eos, Fluid: 4 %
Lymphs, Fluid: 66 %
Monocyte-Macrophage-Serous Fluid: 21 % — ABNORMAL LOW (ref 50–90)
Neutrophil Count, Fluid: 9 % (ref 0–25)
Total Nucleated Cell Count, Fluid: 3725 uL — ABNORMAL HIGH (ref 0–1000)

## 2024-10-17 LAB — MISC LABCORP TEST (SEND OUT): Labcorp test code: 140140

## 2024-10-17 LAB — CBC
HCT: 44 % (ref 39.0–52.0)
Hemoglobin: 14.5 g/dL (ref 13.0–17.0)
MCH: 30 pg (ref 26.0–34.0)
MCHC: 33 g/dL (ref 30.0–36.0)
MCV: 90.9 fL (ref 80.0–100.0)
Platelets: 347 K/uL (ref 150–400)
RBC: 4.84 MIL/uL (ref 4.22–5.81)
RDW: 13 % (ref 11.5–15.5)
WBC: 10.7 K/uL — ABNORMAL HIGH (ref 4.0–10.5)
nRBC: 0 % (ref 0.0–0.2)

## 2024-10-17 LAB — BASIC METABOLIC PANEL WITH GFR
Anion gap: 16 — ABNORMAL HIGH (ref 5–15)
BUN: 17 mg/dL (ref 8–23)
CO2: 19 mmol/L — ABNORMAL LOW (ref 22–32)
Calcium: 9.2 mg/dL (ref 8.9–10.3)
Chloride: 100 mmol/L (ref 98–111)
Creatinine, Ser: 0.94 mg/dL (ref 0.61–1.24)
GFR, Estimated: >60 mL/min
Glucose, Bld: 155 mg/dL — ABNORMAL HIGH (ref 70–99)
Potassium: 4.6 mmol/L (ref 3.5–5.1)
Sodium: 135 mmol/L (ref 135–145)

## 2024-10-17 LAB — GLUCOSE, CAPILLARY
Glucose-Capillary: 304 mg/dL — ABNORMAL HIGH (ref 70–99)
Glucose-Capillary: 205 mg/dL — ABNORMAL HIGH (ref 70–99)

## 2024-10-17 LAB — HEMOGLOBIN A1C
Hgb A1c MFr Bld: 8.3 % — ABNORMAL HIGH (ref 4.8–5.6)
Mean Plasma Glucose: 191.51 mg/dL

## 2024-10-17 LAB — HIV ANTIBODY (ROUTINE TESTING W REFLEX): HIV Screen 4th Generation wRfx: NONREACTIVE

## 2024-10-17 MED ORDER — METFORMIN HCL 500 MG PO TABS: 1000.0000 mg | ORAL_TABLET | Freq: Two times a day (BID) | ORAL | Status: DC

## 2024-10-17 MED ORDER — EMPAGLIFLOZIN 25 MG PO TABS
25.0000 mg | ORAL_TABLET | Freq: Every day | ORAL | Status: DC
  Administered 2024-10-17 – 2024-10-18 (×2): 25 mg via ORAL
  Filled 2024-10-17 (×3): qty 1

## 2024-10-17 MED ORDER — VANCOMYCIN HCL 2000 MG/400ML IV SOLN
2000.0000 mg | Freq: Once | INTRAVENOUS | Status: AC
  Administered 2024-10-17: 2000 mg via INTRAVENOUS
  Filled 2024-10-17: qty 400

## 2024-10-17 MED ORDER — VANCOMYCIN HCL 1750 MG/350ML IV SOLN: 1750.0000 mg | INTRAVENOUS | Status: DC

## 2024-10-17 MED ORDER — PIPERACILLIN-TAZOBACTAM 3.375 G IVPB
3.3750 g | Freq: Three times a day (TID) | INTRAVENOUS | Status: DC
  Administered 2024-10-17 (×2): 3.375 g via INTRAVENOUS
  Filled 2024-10-17 (×2): qty 50

## 2024-10-17 MED ORDER — LIDOCAINE-EPINEPHRINE 1 %-1:100000 IJ SOLN
INTRAMUSCULAR | Status: AC
  Filled 2024-10-17: qty 20

## 2024-10-17 NOTE — ED Notes (Signed)
 Walked patient to the bathroom patient did well patient is now sitting up with call bell in reach

## 2024-10-17 NOTE — Assessment & Plan Note (Signed)
No chest pain, no acute coronary syndrome.  

## 2024-10-17 NOTE — Assessment & Plan Note (Addendum)
 Multiple rib fractures sp mechanical fall.  Likely traumatic related left pleural effusion.   Patient had thoracentesis, and 1 L blood tinged fluid removed.  Plan to continue oral analgesics and 02 monitoring Follow up chest radiograph in am.  Out of bed  Low pre test probability for pneumonia, will hold on antibiotic therapy for now.

## 2024-10-17 NOTE — Procedures (Signed)
 PROCEDURE SUMMARY:  Successful image-guided diagnostic and therapeutic thoracentesis from the left chest.  Yielded 1 liter of blood-tinged fluid.  No immediate complications.  EBL: zero Patient tolerated well.   Specimen sent for labs.  Post-procedure CXR ordered prior to departure from department.   Please see imaging section of Epic for full dictation.  [place]essee Hanlon NP 10/17/2024 11:15 AM

## 2024-10-17 NOTE — Evaluation (Signed)
 Physical Therapy Evaluation Patient Details Name: [name] MRN: [id number] DOB: 03-14-1956 Today's Date: 10/17/2024  History of Present Illness  Pt is 68 year old presented [facility] on  10/16/24 for lt chest pain and SOB. Pt found to have large lt pleural effusion and acute fx's of 8-10 lt ribs and subacute fx 12th rib lt. Pt fell in November for the 12th rib fx and reports more falls since then. Underwent thoracentesis on 10/17/24. PMH - CAD, PCI, HTN, DM  Clinical Impression  Pt presents to PT with history of multiple falls. Pt states he feels unsteady but denies any spinning, dizziness, or light headedness. From pt's description and what I saw with his ambulation it seems he does okay as long as everything is stable but if there is any perturbation (ie catching his foot on obstacle) he can't maintain or regain his balance. His sitting balance appears good if he is on stable surface with feet flat on floor but when he sat on edge of stretcher with feet dangling he couldn't maintain static balance even with UE support. Tried a rolling walker without any noticeable change in stability. Recommend return home with OPPT to address chronic balance issues. Will follow while here.        If plan is discharge home, recommend the following: A little help with walking and/or transfers;Assistance with cooking/housework;Help with stairs or ramp for entrance;Assist for transportation   Can travel by private vehicle        Equipment Recommendations None recommended by PT  Recommendations for Other Services       Functional Status Assessment       Precautions / Restrictions Precautions Precautions: Fall Recall of Precautions/Restrictions: Impaired Precaution/Restrictions Comments: Was lying crossways on stretcher with 1 foot in bedside chair when room entered. Restrictions Weight Bearing Restrictions Per Provider Order: No      Mobility  Bed Mobility Overal bed mobility: Needs  Assistance Bed Mobility: Supine to Sit     Supine to sit: Min assist     General bed mobility comments: Assist to elevate trunk into sitting    Transfers Overall transfer level: Needs assistance Equipment used: None, Rolling walker (2 wheels) Transfers: Sit to/from Stand, Bed to chair/wheelchair/BSC Sit to Stand: Contact guard assist, Supervision   Step pivot transfers: Contact guard assist       General transfer comment: Assist for safety. Sit to stand improved after up initially    Ambulation/Gait Ambulation/Gait assistance: Contact guard assist Gait Distance (Feet): 150 Feet (x 2) Assistive device: None, Rolling walker (2 wheels) Gait Pattern/deviations: Step-through pattern, Decreased stride length, Wide base of support Gait velocity: decr Gait velocity interpretation: 1.31 - 2.62 ft/sec, indicative of limited community ambulator   General Gait Details: Generally steady with or without walker on level surface without challenges.  Stairs            Wheelchair Mobility     Tilt Bed    Modified Rankin (Stroke Patients Only)       Balance Overall balance assessment: Needs assistance Sitting-balance support: Bilateral upper extremity supported, Feet unsupported Sitting balance-Leahy Scale: Poor Sitting balance - Comments: Assist to maintain balance on edge of stretcher where feet don't touch floor. Sitting in chair with feet firmly on floor and his balance is good. Postural control: Posterior lean Standing balance support: No upper extremity supported Standing balance-Leahy Scale: Fair Standing balance comment: Fair to good in predictable environment  Pertinent Vitals/Pain Pain Assessment Pain Assessment: No/denies pain    Home Living Family/patient expects to be discharged to:: Private residence Living Arrangements: Spouse/significant other;Children Available Help at Discharge: Family;Available 24  hours/day Type of Home: House Home Access: Stairs to enter   Entrance Stairs-Number of Steps: 1-2 Alternate Level Stairs-Number of Steps: split level home. Enters from back patio onto main level that has kitchen. Bedroom/bathroom up 5 stairs and living area 10 steps down from main level. Home Layout: Multi-level Home Equipment: [place] - single point      Prior Function Prior Level of Function : Independent/Modified Independent;History of Falls (last six months)             Mobility Comments: Uses cane at times       Extremity/Trunk Assessment   Upper Extremity Assessment Upper Extremity Assessment: Defer to OT evaluation    Lower Extremity Assessment Lower Extremity Assessment: Generalized weakness       Communication   Communication Communication: No apparent difficulties    Cognition Arousal: Alert Behavior During Therapy: WFL for tasks assessed/performed   PT - Cognitive impairments: Safety/Judgement                         Following commands: Intact       Cueing Cueing Techniques: Verbal cues     General Comments      Exercises     Assessment/Plan    PT Assessment Patient needs continued PT services  PT Problem List Decreased strength;Decreased mobility;Decreased balance       PT Treatment Interventions DME instruction;Functional mobility training;Balance training;Patient/family education;Gait training;Therapeutic activities;Stair training;Therapeutic exercise    PT Goals (Current goals can be found in the Care Plan section)  Acute Rehab PT Goals Patient Stated Goal: go home. Improve balance PT Goal Formulation: With patient Time For Goal Achievement: 10/31/24 Potential to Achieve Goals: Good    Frequency Min 2X/week     Co-evaluation               AM-PAC PT 6 Clicks Mobility  Outcome Measure Help needed turning from your back to your side while in a flat bed without using bedrails?: A Little Help needed moving from  lying on your back to sitting on the side of a flat bed without using bedrails?: A Little Help needed moving to and from a bed to a chair (including a wheelchair)?: A Little Help needed standing up from a chair using your arms (e.g., wheelchair or bedside chair)?: A Little Help needed to walk in hospital room?: A Little Help needed climbing 3-5 steps with a railing? : A Little 6 Click Score: 18    End of Session Equipment Utilized During Treatment: Gait belt Activity Tolerance: Patient tolerated treatment well Patient left: in chair;with call bell/phone within reach Nurse Communication: Mobility status PT Visit Diagnosis: Unsteadiness on feet (R26.81);Other abnormalities of gait and mobility (R26.89);Repeated falls (R29.6)    Time: 8467-8440 PT Time Calculation (min) (ACUTE ONLY): 27 min   Charges:   PT Evaluation $PT Eval Low Complexity: 1 Low PT Treatments $Gait Training: 8-22 mins PT General Charges $$ ACUTE PT VISIT: 1 Visit         [facility] PT Acute Rehabilitation Services Office [phone]   [place] ORN [facility] 10/17/2024, 4:39 PM

## 2024-10-17 NOTE — Progress Notes (Signed)
 Transition of Care [facility]) - Inpatient Brief Assessment   Patient Details  Name: [name] MRN: [id number] Date of Birth: [birth year]  Transition of [facility]) CM/SW Contact:    Camelia JONETTA Cary, RN Phone Number: 10/17/2024, 2:37 PM   Clinical Narrative:  MOON letter signed and given.  Transition of Care Department [facility]) has reviewed patient and no TOC needs have been identified at this time. We will continue to monitor patient advancement through interdisciplinary progression rounds. If new patient transition needs arise, please place a TOC consult.      Transition of Care Asessment: Insurance and Status: (P) Insurance coverage has been reviewed Patient has primary care physician: (P) Yes         Readmission risk has been reviewed: (P) Yes

## 2024-10-17 NOTE — Assessment & Plan Note (Addendum)
 Patient was placed on insulin  sliding scale for glucose cover and monitoring, during his hospitalization His glucose remained well controlled with fasting glucose at the time of discharge of 145 mg/dl.   Continue with glimepiride, SGLT 2 inh and metformin

## 2024-10-17 NOTE — Progress Notes (Addendum)
" °  Progress Note   Patient: [name] FMW:[id number] DOB: October 21, 1955 DOA: 10/16/2024     0 DOS: the patient was seen and examined on 10/17/2024   Brief hospital course: [name] was admitted to the hospital with left pleural effusion in the setting or left rib fractures.   68 yo male with the past medical history of coronary artery disease, hypertension, T2DM, and recent left rib fractures who presented with left sided chest pain.  08/2204, had a mechanical fall, hitting his chest wall and developing 12th rib fracture. At home he had persistent pain prompting him to come back to the ED.  On his initial physical examination his blood pressure was 165/89, HR 102, RR 21 and 02 saturation 96% Lungs with decreased breath sounds on the left, with no wheezing or rhonchi, heart with S1 and S2 present and regular, abdomen soft and non tender and no lower extremity edema.   Chest radiograph with moderate left pleural effusion, no infiltrates, no pneumothorax CT chest no pulmonary embolism. New large left pleural effusion, partially loculated superiorly, with associated compressive atelectasis of the left lower lobe and band of atelectasis in the left upper lobe.  Multiple left rib fractures, acute mildly displaced lateral 8th, acute lateral 9th, acute displaced 10th and healing subacute posterior 12th.  There is also a new subacute lateral 7th.  Left thyroid  nodule measuring 2.5 cm, recommended non emergent thyroid  ultrasound.   Assessment and Plan: * Pleural effusion on left Multiple rib fractures sp mechanical fall.  Likely traumatic related left pleural effusion.   Patient had thoracentesis, and 1 L blood tinged fluid removed.  Plan to continue oral analgesics and 02 monitoring Follow up chest radiograph in am.  Out of bed  Low pre test probability for pneumonia, will hold on antibiotic therapy for now.   CAD S/P percutaneous coronary angioplasty No chest pain, no acute coronary  syndrome   Essential hypertension Continue blood pressure monitoring  Continue carvedilol  and ramipril .   Hyponatremia Hyponatremia has resolved, renal function continue to be stable   Type 2 diabetes mellitus (HCC) Continue insulin  sliding scale for glucose cover and monitoring  Resume SGLT 2 inh and metformin   Thyroid  nodule Follow up as outpatient         Subjective: Patient with improvement in chest pain, no dyspnea at rest, no nausea or vomiting, no PND, or orthopnea   Physical Exam: Vitals:   10/17/24 1000 10/17/24 1222 10/17/24 1309 10/17/24 1310  BP: 124/75  (!) 142/72 (!) 147/72  Pulse: 86  79 80  Resp: 19  19 20   Temp:  98 F (36.7 C)    TempSrc:  Oral    SpO2: 96%  97% 97%  Weight:      Height:       Neurology awake and alert ENT with mild pallor with no icterus Cardiovascular with S1 and S2 present and regular with no gallops, rubs or murmurs Respiratory with decreased breath sounds on the left with no wheezing or rhonchi Abdomen with no distention, soft and non tender No lower extremity edema   Data Reviewed:    Family Communication: no family at the bedside   Disposition: Status is: Observation The patient remains OBS appropriate and will d/c before 2 midnights.  Planned Discharge Destination: Home     Author: Elidia Toribio Furnace, MD 10/17/2024 3:59 PM  For on call review [email or web address].  "

## 2024-10-17 NOTE — Assessment & Plan Note (Signed)
 Follow up as outpatient with thyroid  ultrasound

## 2024-10-17 NOTE — Plan of Care (Signed)

## 2024-10-17 NOTE — Progress Notes (Signed)
 Pharmacy Antibiotic Note  [name] is a 68 y.o. male admitted on 10/16/2024 with pneumonia.  Pharmacy has been consulted for Vancomycin  dosing. WBC 10.7. Renal function appears ok. CT with large pleural effusion.   Plan: Vancomycin  2000 mg IV x 1, then 1750 mg IV q24h >>>Estimated AUC: 507 Zosyn  per MD Trend WBC, temp, renal function  F/U infectious work-up Drug levels as indicated   Height: 5' 7 (170.2 cm) Weight: 113.4 kg (250 lb) IBW/kg (Calculated) : 66.1  Temp (24hrs), Avg:98.2 F (36.8 C), Min:97.8 F (36.6 C), Max:98.5 F (36.9 C)  Recent Labs  Lab 10/16/24 1020 10/17/24 0137  WBC 10.9* 10.7*  CREATININE 0.96 0.94    Estimated Creatinine Clearance: 87.9 mL/min (by C-G formula based on SCr of 0.94 mg/dL).    Allergies[1]  Lynwood Mckusick, PharmD, BCPS Clinical Pharmacist Phone: [phone]      [1]  Allergies Allergen Reactions   Colesevelam     Other Reaction(s): GI upset   Crestor [Rosuvastatin]     Muscle ache   Dulaglutide     Other Reaction(s): gi intolerance   Plavix [Clopidogrel Bisulfate]     Questionable rash   Pravachol [Pravastatin]     myalgia   Semaglutide     Other Reaction(s): GI intol   Statins    Welchol [Colesevelam Hcl]     Gi upset    Zocor [Simvastatin]     Muscle ache

## 2024-10-17 NOTE — Assessment & Plan Note (Signed)
 Continue blood pressure monitoring  Continue carvedilol  and ramipril .

## 2024-10-17 NOTE — Assessment & Plan Note (Signed)
 Hyponatremia has resolved, renal function continue to be stable  At the time of discharge serum cr is 0,97 with K at 4,5 and serum bicarbonate at 19 Na 136

## 2024-10-17 NOTE — Care Management Obs Status (Signed)
 MEDICARE OBSERVATION STATUS NOTIFICATION   Patient Details  Name: [name] MRN: [id number] Date of Birth: 07-01-1956   Medicare Observation Status Notification Given:  Yes    Camelia JONETTA Cary, RN 10/17/2024, 2:36 PM

## 2024-10-17 NOTE — Hospital Course (Signed)
 [name] was admitted to the hospital with left pleural effusion in the setting or left rib fractures.   68 yo male with the past medical history of coronary artery disease, hypertension, T2DM, and recent left rib fractures who presented with left sided chest pain.  08/2204, had a mechanical fall, hitting his left chest wall, while waling down the wooden stars. He was diagnosed with 12th rib fracture, he was discharged home on oral analgesics.  At home he had another mechanical fall while walking at dark. He noted recurrent and persistent chest wall pain prompting him to come back to the ED.  On his initial physical examination his blood pressure was 165/89, HR 102, RR 21 and 02 saturation 96% Lungs with decreased breath sounds on the left, with no wheezing or rhonchi, heart with S1 and S2 present and regular, abdomen soft and non tender and no lower extremity edema.   Na 134, K 5.0 Cl 98 bicarbonate 20 glucose 167, bun 13 cr 0,96  High sensitive troponin 16 and 16  Wbc 10,9 hgb 15.5 plt 343   Chest radiograph with moderate left pleural effusion, no infiltrates, no pneumothorax CT chest no pulmonary embolism. New large left pleural effusion, partially loculated superiorly, with associated compressive atelectasis of the left lower lobe and band of atelectasis in the left upper lobe.  Multiple left rib fractures, acute mildly displaced lateral 8th, acute lateral 9th, acute displaced 10th and healing subacute posterior 12th.  There is also a new subacute lateral 7th.  Left thyroid  nodule measuring 2.5 cm, recommended non emergent thyroid  ultrasound.   EKG 100 bpm, right axis deviation, right bundle branch block, qtc 485, sinus rhythm with no significant ST segment or T wave changes.   Patient underwent ultrasound guided thoracentesis, obtaining 1 L of blood tinged fluid with improvement in his symptoms.   12/30 follow up chest radiograph with small left pleural effusion with no  pneumothorax Plan to continue as needed analgesics and follow up chest radiograph in 7 to 10 days as outpatient.

## 2024-10-18 ENCOUNTER — Observation Stay (HOSPITAL_COMMUNITY)

## 2024-10-18 DIAGNOSIS — E871 Hypo-osmolality and hyponatremia: Secondary | ICD-10-CM | POA: Diagnosis not present

## 2024-10-18 DIAGNOSIS — I2510 Atherosclerotic heart disease of native coronary artery without angina pectoris: Secondary | ICD-10-CM | POA: Diagnosis not present

## 2024-10-18 DIAGNOSIS — I10 Essential (primary) hypertension: Secondary | ICD-10-CM | POA: Diagnosis not present

## 2024-10-18 DIAGNOSIS — J90 Pleural effusion, not elsewhere classified: Secondary | ICD-10-CM | POA: Diagnosis not present

## 2024-10-18 LAB — CBC
HCT: 43.5 % (ref 39.0–52.0)
Hemoglobin: 14.5 g/dL (ref 13.0–17.0)
MCH: 30.3 pg (ref 26.0–34.0)
MCHC: 33.3 g/dL (ref 30.0–36.0)
MCV: 91 fL (ref 80.0–100.0)
Platelets: 316 K/uL (ref 150–400)
RBC: 4.78 MIL/uL (ref 4.22–5.81)
RDW: 13.2 % (ref 11.5–15.5)
WBC: 9.4 K/uL (ref 4.0–10.5)
nRBC: 0 % (ref 0.0–0.2)

## 2024-10-18 LAB — BASIC METABOLIC PANEL WITH GFR
Anion gap: 14 (ref 5–15)
BUN: 19 mg/dL (ref 8–23)
CO2: 19 mmol/L — ABNORMAL LOW (ref 22–32)
Calcium: 9.4 mg/dL (ref 8.9–10.3)
Chloride: 102 mmol/L (ref 98–111)
Creatinine, Ser: 0.97 mg/dL (ref 0.61–1.24)
GFR, Estimated: >60 mL/min
Glucose, Bld: 145 mg/dL — ABNORMAL HIGH (ref 70–99)
Potassium: 4.5 mmol/L (ref 3.5–5.1)
Sodium: 136 mmol/L (ref 135–145)

## 2024-10-18 LAB — GLUCOSE, CAPILLARY: Glucose-Capillary: 284 mg/dL — ABNORMAL HIGH (ref 70–99)

## 2024-10-18 LAB — LD, BODY FLUID (OTHER)
LD, Body Fluid: 206 IU/L
Source of Sample: 100156

## 2024-10-18 LAB — GLUCOSE, BODY FLUID OTHER
Glucose, Body Fluid Other: 218 mg/dL
Source of Sample: 19497

## 2024-10-18 MED ORDER — ACETAMINOPHEN 325 MG PO TABS: 650.0000 mg | ORAL_TABLET | Freq: Four times a day (QID) | ORAL | Status: AC | PRN

## 2024-10-18 MED ORDER — METFORMIN HCL 1000 MG PO TABS: 1000.0000 mg | ORAL_TABLET | Freq: Two times a day (BID) | ORAL | Status: AC

## 2024-10-18 NOTE — TOC Transition Note (Signed)
 Transition of Care [facility]) - Discharge Note   Patient Details  Name: [name] MRN: [id number] Date of Birth: 1955/12/03  [facility] CM/SW Contact:  Sudie Erminio Deems, RN Phone Number: 10/18/2024, 10:49 AM   Clinical Narrative: Patient presented for pleural effusions. PTA patient was from home with spouse. Patient does not use any DME in the home. Patient has PCP and will schedule a follow up appointment. An ambulatory referral has been submitted to [facility] for OPPT. No further needs identified at this time.    Final next level of care: OP Rehab Barriers to Discharge: Continued Medical Work up   Patient Goals and CMS Choice Patient states their goals for this hospitalization and ongoing recovery are:: Plan to return home with spouse.   Choice offered to / list presented to : NA      Discharge Placement                       Discharge Plan and Services Additional resources added to the After Visit Summary for   In-house Referral: NA Discharge Planning Services: CM Consult Post Acute Care Choice: NA            DME Agency: NA       HH Arranged: NA          Social Drivers of Health (SDOH) Interventions SDOH Screenings   Food Insecurity: No Food Insecurity (10/17/2024)  Housing: Low Risk (10/17/2024)  Transportation Needs: No Transportation Needs (10/17/2024)  Utilities: Not At Risk (10/17/2024)  Social Connections: Moderately Isolated (10/17/2024)  Tobacco Use: Unknown (10/16/2024)     Readmission Risk Interventions     No data to display

## 2024-10-18 NOTE — Evaluation (Signed)
 Occupational Therapy Evaluation Patient Details Name: [name] MRN: [id number] DOB: 12-Dec-1955 Today's Date: 10/18/2024   History of Present Illness   Pt is 68 year old presented to [facility] on  10/16/24 for lt chest pain and SOB. Pt found to have large lt pleural effusion and acute fx's of 8-10 lt ribs and subacute fx 12th rib lt. Pt fell in November for the 12th rib fx and reports more falls since then. Underwent thoracentesis on 10/17/24. PMH - CAD, PCI, HTN, DM     Clinical Impressions Pt is typically mod I for mobility with SPC PRN. He does his own ADL, wife does IADL. Pt reports MULTIPLE falls of legs just give out Today Pt was GCA for all aspects of ADL and transfers. No physical assist needed and no assist to complete ADL tasks just guard for safety. Pt reports that falls happen most when he is first standing up on EOB - he has a chair bedside for this very reason. At this time no follow up OT is indicated at this time. OT provided education for fall prevention and safety with ADL - especially with shower. OT will sign off at this time.      If plan is discharge home, recommend the following:   A little help with walking and/or transfers     Functional Status Assessment   Patient has had a recent decline in their functional status and demonstrates the ability to make significant improvements in function in a reasonable and predictable amount of time.     Equipment Recommendations   None recommended by OT     Recommendations for Other Services         Precautions/Restrictions   Precautions Precautions: Fall Recall of Precautions/Restrictions: Intact Restrictions Weight Bearing Restrictions Per Provider Order: No     Mobility Bed Mobility Overal bed mobility: Needs Assistance Bed Mobility: Supine to Sit     Supine to sit: Supervision     General bed mobility comments: no physical assist needed from hospital bed. Reports that he has a chair  alongside his bed at home to assist with power ups. He also reports that this is the place that he has the most falls. Educated on Pt to do basic BLE exercises from seated position (ankle pumps, kicks, and seated marching x10) prior to attempt to stand to see if that makes a difference.    Transfers Overall transfer level: Needs assistance Equipment used: None Transfers: Sit to/from Stand, Bed to chair/wheelchair/BSC Sit to Stand: Supervision     Step pivot transfers: Contact guard assist     General transfer comment: GCA for safety because of falls but no assist needed      Balance Overall balance assessment: Needs assistance Sitting-balance support: Feet unsupported Sitting balance-Leahy Scale: Good Sitting balance - Comments: able to sit EOB and in recliner and access BLE   Standing balance support: No upper extremity supported Standing balance-Leahy Scale: Fair Standing balance comment: Fair to good in predictable environment                           ADL either performed or assessed with clinical judgement   ADL Overall ADL's : Modified independent;At baseline                                       General ADL Comments: Pt able  to demonstrate transfers from bed and chair, stand for grooming at sink, demonstrate LB ADL without LOB or need for assist in any capacity. Did educated on shower safety with grab bars and DME options.     Vision Baseline Vision/History: 0 No visual deficits Ability to See in Adequate Light: 0 Adequate Patient Visual Report: No change from baseline Vision Assessment?: No apparent visual deficits     Perception         Praxis         Pertinent Vitals/Pain Pain Assessment Pain Assessment: No/denies pain     Extremity/Trunk Assessment Upper Extremity Assessment Upper Extremity Assessment: Overall WFL for tasks assessed   Lower Extremity Assessment Lower Extremity Assessment: Defer to PT evaluation        Communication Communication Communication: No apparent difficulties   Cognition Arousal: Alert Behavior During Therapy: WFL for tasks assessed/performed Cognition: No apparent impairments                               Following commands: Intact       Cueing  General Comments   Cueing Techniques: Verbal cues      Exercises     Shoulder Instructions      Home Living Family/patient expects to be discharged to:: Private residence Living Arrangements: Spouse/significant other;Children Available Help at Discharge: Family;Available 24 hours/day Type of Home: House Home Access: Stairs to enter [employer] of Steps: 1-2   Home Layout: Multi-level Alternate Level Stairs-Number of Steps: split level home. Enters from back patio onto main level that has kitchen. Bedroom/bathroom up 5 stairs and living area 10 steps down from main level. Alternate Level Stairs-Rails: Right Bathroom Shower/Tub: Walk-in shower;Door   [employer] Toilet: Standard Bathroom Accessibility: Yes How Accessible: Accessible via walker Home Equipment: Cane - single point          Prior Functioning/Environment Prior Level of Function : Independent/Modified Independent;History of Falls (last six months)             Mobility Comments: Uses cane at times ADLs Comments: independent in ADL, no falls during bathing/shower or dressing LB    OT Problem List: Impaired balance (sitting and/or standing);Obesity;Decreased strength   OT Treatment/Interventions:        OT Goals(Current goals can be found in the care plan section)   Acute Rehab OT Goals Patient Stated Goal: stop falling OT Goal Formulation: With patient/family Time For Goal Achievement: 11/01/24 Potential to Achieve Goals: Good   OT Frequency:       Co-evaluation              AM-PAC OT 6 Clicks Daily Activity     Outcome Measure Help from another person eating meals?: None Help from another person  taking care of personal grooming?: None Help from another person toileting, which includes using toliet, bedpan, or urinal?: None Help from another person bathing (including washing, rinsing, drying)?: None Help from another person to put on and taking off regular upper body clothing?: None Help from another person to put on and taking off regular lower body clothing?: None 6 Click Score: 24   End of Session Equipment Utilized During Treatment: Gait belt Nurse Communication: Mobility status  Activity Tolerance: Patient tolerated treatment well Patient left: in chair;with chair alarm set;with call bell/phone within reach;with family/visitor present  OT Visit Diagnosis: History of falling (Z91.81)  Time: 9044-8963 OT Time Calculation (min): 41 min Charges:  OT General Charges $OT Visit: 1 Visit OT Evaluation $OT Eval Low Complexity: 1 Low  Leita DEL OTR/L Acute Rehabilitation Services Office: [phone]  Leita PARAS [facility] 10/18/2024, 11:21 AM

## 2024-10-18 NOTE — Plan of Care (Signed)

## 2024-10-18 NOTE — Discharge Summary (Addendum)
 " Physician Discharge Summary   Patient: [name] MRN: [id number] DOB: May 02, 1956  Admit date:     10/16/2024  Discharge date: 10/18/2024  Discharge Physician: Elidia Sieving Tovia Kisner   PCP: Ransom Other, MD   Recommendations at discharge:    Plan to follow up chest radiograph as outpatient in 7 to 10 days.  Follow up with thyroid  nodule, will need outpatient thyroid  ultrasound Resume metformin on 10/20/24 due to recent contrast exposure Follow up with outpatient physical therapy  Follow up with Dr Husain in 7 to 10 days   Discharge Diagnoses: Principal Problem:   Pleural effusion on left Active Problems:   CAD S/P percutaneous coronary angioplasty   Essential hypertension   Hyponatremia   Type 2 diabetes mellitus (HCC)   Thyroid  nodule   Multiple rib fractures  Resolved Problems:   * No resolved hospital problems. [facility] Course: [name] was admitted to the hospital with left pleural effusion in the setting or left rib fractures.   68 yo male with the past medical history of coronary artery disease, hypertension, T2DM, and recent left rib fractures who presented with left sided chest pain.  08/2204, had a mechanical fall, hitting his left chest wall, while waling down the wooden stars. He was diagnosed with 12th rib fracture, he was discharged home on oral analgesics.  At home he had another mechanical fall while walking at dark. He noted recurrent and persistent chest wall pain prompting him to come back to the ED.  On his initial physical examination his blood pressure was 165/89, HR 102, RR 21 and 02 saturation 96% Lungs with decreased breath sounds on the left, with no wheezing or rhonchi, heart with S1 and S2 present and regular, abdomen soft and non tender and no lower extremity edema.   Na 134, K 5.0 Cl 98 bicarbonate 20 glucose 167, bun 13 cr 0,96  High sensitive troponin 16 and 16  Wbc 10,9 hgb 15.5 plt 343   Chest radiograph with moderate left  pleural effusion, no infiltrates, no pneumothorax CT chest no pulmonary embolism. New large left pleural effusion, partially loculated superiorly, with associated compressive atelectasis of the left lower lobe and band of atelectasis in the left upper lobe.  Multiple left rib fractures, acute mildly displaced lateral 8th, acute lateral 9th, acute displaced 10th and healing subacute posterior 12th.  There is also a new subacute lateral 7th.  Left thyroid  nodule measuring 2.5 cm, recommended non emergent thyroid  ultrasound.   EKG 100 bpm, right axis deviation, right bundle branch block, qtc 485, sinus rhythm with no significant ST segment or T wave changes.   Patient underwent ultrasound guided thoracentesis, obtaining 1 L of blood tinged fluid with improvement in his symptoms.   12/30 follow up chest radiograph with small left pleural effusion with no pneumothorax Plan to continue as needed analgesics and follow up chest radiograph in 7 to 10 days as outpatient.   Assessment and Plan: * Pleural effusion on left Multiple rib fractures sp mechanical fall.  Likely traumatic related left pleural effusion.   Patient had thoracentesis, and 1 L blood tinged fluid removed.  Fluid analysis  Red color, wbc 3,725, 66% lymphocytes, 21% monocytes.  No organism seen and culture with no growth.   At the time of discharge follow up chest radiograph with small left pleural effusion.  02 saturation is 94% on room air  Plan to follow up chest radiograph as outpatient in 7 to 10 days.  Continue as  needed analgesics.  Patient was ruled out for pneumonia   CAD S/P percutaneous coronary angioplasty No chest pain, no acute coronary syndrome  Holding aspirin    Essential hypertension Continue blood pressure monitoring  Continue carvedilol  and ramipril .   Hyponatremia Hyponatremia has resolved, renal function continue to be stable  At the time of discharge serum cr is 0,97 with K at 4,5 and serum  bicarbonate at 19 Na 136   Type 2 diabetes mellitus (HCC) Patient was placed on insulin  sliding scale for glucose cover and monitoring, during his hospitalization His glucose remained well controlled with fasting glucose at the time of discharge of 145 mg/dl.   Continue with SGLT 2 inh and metformin   Thyroid  nodule Follow up as outpatient with thyroid  ultrasound   Consultants: IR  Procedures performed: left ultrasound guided thoracentesis   Disposition: Home Diet recommendation:  Cardiac and Carb modified diet DISCHARGE MEDICATION: Allergies as of 10/18/2024       Reactions   Colesevelam    Other Reaction(s): GI upset   Crestor [rosuvastatin]    Muscle ache   Dulaglutide    Other Reaction(s): gi intolerance   Plavix [clopidogrel Bisulfate]    Questionable rash   Pravachol [pravastatin]    myalgia   Semaglutide    Other Reaction(s): GI intol   Statins    Welchol [colesevelam Hcl]    Gi upset   Zocor [simvastatin]    Muscle ache        Medication List     STOP taking these medications    aspirin  81 MG tablet   lidocaine  5 % Commonly known as: Lidoderm    oxyCODONE  5 MG immediate release tablet Commonly known as: Roxicodone        TAKE these medications    acetaminophen  325 MG tablet Commonly known as: TYLENOL  Take 2 tablets (650 mg total) by mouth every 6 (six) hours as needed for mild pain (pain score 1-3) or moderate pain (pain score 4-6) (or Fever >/= 101).   albuterol 108 (90 Base) MCG/ACT inhaler Commonly known as: VENTOLIN HFA Inhale into the lungs every 6 (six) hours as needed for wheezing or shortness of breath.   carvedilol  12.5 MG tablet Commonly known as: COREG  Take 12.5 mg by mouth 2 (two) times daily with a meal.   cholecalciferol 25 MCG (1000 UNIT) tablet Commonly known as: VITAMIN D3 1 tablet Orally Once a day   glimepiride 4 MG tablet Commonly known as: AMARYL Take 4 mg by mouth 2 (two) times daily.   hydrocortisone  1 %  ointment Apply 1 Application topically 2 (two) times daily. Apply to rash twice daily. What changed:  when to take this reasons to take this additional instructions   Jardiance  25 MG Tabs tablet Generic drug: empagliflozin  Take 25 mg by mouth daily.   loratadine 10 MG tablet Commonly known as: CLARITIN Take 10 mg by mouth See admin instructions. Takes only during the spring season for allergies.   metFORMIN 1000 MG tablet Commonly known as: GLUCOPHAGE Take 1 tablet (1,000 mg total) by mouth 2 (two) times daily with a meal. Start taking on: October 20, 2024 What changed: These instructions start on October 20, 2024. If you are unsure what to do until then, ask your doctor or other care provider.   nitroGLYCERIN 0.4 MG SL tablet Commonly known as: NITROSTAT Place 0.4 mg under the tongue every 5 (five) minutes as needed for chest pain.   ramipril  5 MG capsule Commonly known as: ALTACE  Take 5  mg by mouth daily.   Repatha 140 MG/ML Sosy Generic drug: Evolocumab Inject 1 mL into the skin every 14 (fourteen) days. Has medication at home but has not started yet.        Discharge Exam: Filed Weights   10/16/24 1009  Weight: 113.4 kg   BP 138/73 (BP Location: Right Arm)   Pulse 84   Temp 98.2 F (36.8 C) (Oral)   Resp 17   Ht 5' 7 (1.702 m)   Wt 113.4 kg   SpO2 94%   BMI 39.16 kg/m   Patient with no significant chest pain, dyspnea has improved, he has been mobile and out of bed  Neurology awake and alert ENT with no pallor  Cardiovascular with S1 and S2 present and regular with no gallops, rubs or murmurs Respiratory with no wheezing or rhonchi, mild decreased breath sounds at the left side Abdomen with no distention, soft and non tender No lower extremity edema   Condition at discharge: stable  The results of significant diagnostics from this hospitalization (including imaging, microbiology, ancillary and laboratory) are listed below for reference.   Imaging  Studies: IR THORACENTESIS ASP PLEURAL SPACE W/IMG GUIDE Result Date: 10/17/2024 INDICATION: Patient with mechanical fall resulting in 12th rib fracture in November, now with request for thoracentesis for left moderate pleural effusion. EXAM: ULTRASOUND GUIDED LEFT THORACENTESIS MEDICATIONS: 10 mL 1% lidocaine  with epinephrine  COMPLICATIONS: None immediate. PROCEDURE: An ultrasound guided thoracentesis was thoroughly discussed with the patient and questions answered. The benefits, risks, alternatives and complications were also discussed. The patient understands and wishes to proceed with the procedure. Written consent was obtained. Ultrasound was performed to localize and mark an adequate pocket of fluid in the left chest. The area was then prepped and draped in the normal sterile fashion. 1% Lidocaine  with epi was used for local anesthesia. Under ultrasound guidance a 6 Fr Safe-T-Centesis catheter was introduced. Thoracentesis was performed. The catheter was removed and a dressing applied. FINDINGS: A total of approximately 1 liter of blood-tinged fluid fluid was removed. Samples were sent to the laboratory as requested by the clinical team. IMPRESSION: Successful ultrasound guided left thoracentesis yielding 1 liter of pleural fluid. Performed and dictated by Laymon Coast, NP Electronically Signed   By: Cordella Banner   On: 10/17/2024 20:49   DG Chest 1 View Result Date: 10/17/2024 EXAM: 1 VIEW(S) XRAY OF THE CHEST 10/17/2024 11:08:48 AM COMPARISON: 10/16/2024 CLINICAL HISTORY: S/P thoracentesis FINDINGS: LUNGS AND PLEURA: Improved left lung aeration post thoracentesis. Decreased left pleural effusion with small residual volume. Residual left basilar opacity. No pneumothorax. HEART AND MEDIASTINUM: No acute abnormality of the cardiac and mediastinal silhouettes. BONES AND SOFT TISSUES: No acute osseous abnormality. IMPRESSION: 1. Improved left lung aeration post thoracentesis. 2. Decreased left  pleural effusion with small residual volume. No pneumothorax identified after thoracentesis. Electronically signed by: Waddell Calk MD 10/17/2024 01:03 PM EST RP Workstation: [id number]   MR BRAIN WO CONTRAST Result Date: 10/17/2024 EXAM: MRI BRAIN WITHOUT CONTRAST 10/16/2024 11:49:00 PM TECHNIQUE: Multiplanar multisequence MRI of the head/brain was performed without the administration of intravenous contrast. COMPARISON: CT head 09/02/2024. CLINICAL HISTORY: Dizziness. FINDINGS: BRAIN AND VENTRICLES: No acute infarct. No intracranial hemorrhage. No mass. No midline shift. No hydrocephalus. The sella is unremarkable. Normal flow voids. ORBITS: No acute abnormality. SINUSES AND MASTOIDS: No acute abnormality. BONES AND SOFT TISSUES: Normal marrow signal. No acute soft tissue abnormality. IMPRESSION: 1. No acute intracranial abnormality. Electronically signed by: Gilmore Molt 10/17/2024 12:00 AM EST  RP Workstation: [id number]   CT Angio Chest PE W/Cm &/Or Wo Cm Result Date: 10/16/2024 EXAM: CTA CHEST 10/16/2024 08:41:29 PM TECHNIQUE: CTA of the chest was performed without and with the administration of 75 mL of intravenous contrast (iohexol  (OMNIPAQUE ) 350 MG/ML injection 75 mL IOHEXOL  350 MG/ML SOLN). Multiplanar reformatted images are provided for review. MIP images are provided for review. Automated exposure control, iterative reconstruction, and/or weight based adjustment of the mA/kV was utilized to reduce the radiation dose to as low as reasonably achievable. COMPARISON: CT chest abdomen and pelvis 09/02/2024. CLINICAL HISTORY: Pulmonary embolism (PE) suspected, high prob; left pleural effusion. r/o PE. r/o pna. r/o mass. (hx rib fx 1 month ago) FINDINGS: PULMONARY ARTERIES: Pulmonary arteries are adequately opacified for evaluation. No acute pulmonary embolus. Main pulmonary artery is normal in caliber. MEDIASTINUM: 2.5 cm left thyroid  nodule present. The heart and pericardium demonstrate no acute  abnormality. There is no acute abnormality of the thoracic aorta. LYMPH NODES: No mediastinal, hilar or axillary lymphadenopathy. LUNGS AND PLEURA: There is a new large left pleural effusion, partially loculated superiorly. There is compressive atelectasis of the left lower lobe. There is also a band of atelectasis in the left upper lobe. UPPER ABDOMEN: Limited images of the upper abdomen are unremarkable. SOFT TISSUES AND BONES: Subacute lateral left 7th rib fracture present, which is new from prior. There is an acute mildly displaced lateral left 8th rib fracture and an acute appearing lateral 9th rib fracture. There is an acute displaced posterior left 10th rib fracture. There is a healing subacute posterior left 12th rib fracture. IMPRESSION: 1. No pulmonary embolism. 2. New large left pleural effusion, partially loculated superiorly, with associated compressive atelectasis of the left lower lobe and band of atelectasis in the left upper lobe. 3. Multiple left rib fractures, acute mildly displaced lateral 8th, acute lateral 9th, acute displaced posterior 10th, and healing subacute posterior 12th. There is also a new subacute lateral 7th, 4. Left thyroid  nodule measuring 2.5 cm, recommend non-emergent thyroid  ultrasound. Electronically signed by: Greig Pique MD 10/16/2024 09:24 PM EST RP Workstation: [id number]   DG Chest 2 View Result Date: 10/16/2024 EXAM: 2 VIEW(S) XRAY OF THE CHEST 10/16/2024 10:49:00 AM COMPARISON: 03/06/2011  . CLINICAL HISTORY: chest pain FINDINGS: LUNGS AND PLEURA: Left lung base atelectasis/opacity. Moderate left pleural effusion. No pneumothorax. HEART AND MEDIASTINUM: No acute abnormality of the cardiac and mediastinal silhouettes. BONES AND SOFT TISSUES: No acute osseous abnormality. IMPRESSION: 1. Moderate left pleural effusion. 2. Left lung base atelectasis/opacity. Electronically signed by: Waddell Calk MD 10/16/2024 12:14 PM EST RP Workstation: [id number]     Microbiology: Results for orders placed or performed during the hospital encounter of 10/16/24  Body fluid culture w Gram Stain     Status: None (Preliminary result)   Collection Time: 10/17/24 11:01 AM   Specimen: Lung, Left; Pleural Fluid  Result Value Ref Range Status   Specimen Description PLEURAL  Final   Special Requests LT LUNG  Final   Gram Stain   Final    RARE WBC SEEN NO ORGANISMS SEEN Performed at [facility], [address] [place], [place] [address]    Culture PENDING  Incomplete   Report Status PENDING  Incomplete    Labs: CBC: Recent Labs  Lab 10/16/24 1020 10/17/24 0137 10/18/24 0543  WBC 10.9* 10.7* 9.4  HGB 15.5 14.5 14.5  HCT 48.1 44.0 43.5  MCV 92.9 90.9 91.0  PLT 343 347 316   Basic Metabolic Panel: Recent  Labs  Lab 10/16/24 1020 10/17/24 0137 10/18/24 0543  NA 134* 135 136  K 5.0 4.6 4.5  CL 98 100 102  CO2 20* 19* 19*  GLUCOSE 167* 155* 145*  BUN 13 17 19   CREATININE 0.96 0.94 0.97  CALCIUM 9.6 9.2 9.4   Liver Function Tests: No results for input(s): AST, ALT, ALKPHOS, BILITOT, PROT, ALBUMIN in the last 168 hours. CBG: Recent Labs  Lab 10/17/24 0751 10/17/24 1216 10/17/24 1632 10/17/24 2137 10/18/24 0821  GLUCAP 149* 237* 304* 205* 284*    Discharge time spent: greater than 30 minutes.  Signed: Elidia Toribio Furnace, MD [facility] 10/18/2024 "

## 2024-10-20 LAB — BODY FLUID CULTURE W GRAM STAIN: Culture: NO GROWTH

## 2024-10-21 LAB — CYTOLOGY - NON PAP

## 2024-10-31 ENCOUNTER — Encounter: Payer: Self-pay | Admitting: Neurology

## 2024-10-31 ENCOUNTER — Other Ambulatory Visit (HOSPITAL_COMMUNITY): Payer: Self-pay | Admitting: Internal Medicine

## 2024-10-31 DIAGNOSIS — R269 Unspecified abnormalities of gait and mobility: Secondary | ICD-10-CM

## 2024-10-31 DIAGNOSIS — R29898 Other symptoms and signs involving the musculoskeletal system: Secondary | ICD-10-CM

## 2024-11-01 ENCOUNTER — Ambulatory Visit (HOSPITAL_COMMUNITY)
Admission: RE | Admit: 2024-11-01 | Discharge: 2024-11-01 | Disposition: A | Discharge status: Home / Self Care | Source: Ambulatory Visit | Attending: Internal Medicine | Admitting: Internal Medicine

## 2024-11-01 DIAGNOSIS — R29898 Other symptoms and signs involving the musculoskeletal system: Secondary | ICD-10-CM | POA: Insufficient documentation

## 2024-11-01 DIAGNOSIS — R269 Unspecified abnormalities of gait and mobility: Secondary | ICD-10-CM | POA: Diagnosis present

## 2024-11-01 MED ORDER — GADOBUTROL 1 MMOL/ML IV SOLN
10.0000 mL | Freq: Once | INTRAVENOUS | Status: AC | PRN
  Administered 2024-11-01: 10 mL via INTRAVENOUS

## 2024-11-09 ENCOUNTER — Other Ambulatory Visit: Payer: Self-pay

## 2024-11-09 ENCOUNTER — Ambulatory Visit: Attending: Internal Medicine | Admitting: Physical Therapy

## 2024-11-09 ENCOUNTER — Encounter: Payer: Self-pay | Admitting: Physical Therapy

## 2024-11-09 VITALS — BP 146/66 | HR 68

## 2024-11-09 DIAGNOSIS — M6281 Muscle weakness (generalized): Secondary | ICD-10-CM | POA: Diagnosis present

## 2024-11-09 DIAGNOSIS — R2681 Unsteadiness on feet: Secondary | ICD-10-CM | POA: Insufficient documentation

## 2024-11-09 DIAGNOSIS — R2689 Other abnormalities of gait and mobility: Secondary | ICD-10-CM | POA: Insufficient documentation

## 2024-11-09 DIAGNOSIS — R296 Repeated falls: Secondary | ICD-10-CM | POA: Insufficient documentation

## 2024-11-09 NOTE — Therapy (Signed)
 " OUTPATIENT PHYSICAL THERAPY NEURO EVALUATION   Patient Name: [name] MRN: [id number] DOB:1956-03-15, 69 y.o., male Today's Date: 11/09/2024   PCP: Ransom Other, MD REFERRING PROVIDER: Ransom Other, MD  END OF SESSION:  PT End of Session - 11/09/24 1326     Visit Number 1    Number of Visits 17   16 + eval   Date for Recertification  01/19/25   pushed out due to pending weather concerns and scheduling delay   Authorization Type BCBS Medicare    Progress Note Due on Visit 10    PT Start Time 1315    PT Stop Time 1409    PT Time Calculation (min) 54 min    Equipment Utilized During Treatment Gait belt    Activity Tolerance Patient tolerated treatment well    Behavior During Therapy WFL for tasks assessed/performed          Past Medical History:  Diagnosis Date   Asthma    Colon polyp    Diabetes mellitus without complication (HCC)    Elevated LFTs    Fatty liver    Hypertension    Hypogonadism in male    Obesity    Rhinitis    Past Surgical History:  Procedure Laterality Date   CARDIAC CATHETERIZATION     CAROTID STENT     CORONARY ANGIOPLASTY     IR THORACENTESIS RIGHT ASP PLEURAL SPACE W/IMG GUIDE  10/17/2024   UMBILICAL HERNIA REPAIR     Patient Active Problem List   Diagnosis Date Noted   Thyroid  nodule 10/17/2024   Dizziness 10/17/2024   Pleural effusion on left 10/16/2024   CAD S/P percutaneous coronary angioplasty 10/16/2024   Hyponatremia 10/16/2024   Multiple rib fractures 10/16/2024   Essential hypertension 10/16/2024   Angina pectoris 05/27/2023   Type 2 diabetes mellitus (HCC)    Body mass index (BMI) 36.0-36.9, adult 01/07/2023    ONSET DATE: 10/31/2024 (referral)  REFERRING DIAG: R26.9 (ICD-10-CM) - Unspecified abnormalities of gait and mobility  THERAPY DIAG:  Other abnormalities of gait and mobility  Muscle weakness (generalized)  Unsteadiness on feet  Repeated falls  Rationale for Evaluation and Treatment:  Rehabilitation  SUBJECTIVE:                                                                                                                                                                                             SUBJECTIVE STATEMENT: Had pt walk PT through daily routine - he is awake from 7am to 7pm and eats breakfast at his desk, sometimes walks around the house with wife's supervision, and watches TV the  most of the rest of the day.    He reports he is falling more because his legs just give out.  He has done PT before at the [facility] but quit because he had to be pulled out of the hot tub by lifeguard as his legs gave out.  He denies light headedness or dizziness.  He has fallen several times in the shower so got a shower seat.  Denies burning, numbness, or tingling in the feet.  He ambulates w/o AD.  He reports first noticing how bad his balance was about 6 months ago when he started falling more - about 5-6 per month.  He has trouble getting up relying on wife and son to pull him up.  He denies heart palpitations or warning prior to legs giving out.    He has not seen a neurologist and does not follow-up with a cardiologist for problem list.    He recently fell down the stairs in his home and broke his left ribs.  He feels these are healing.    He reports having tinnitus for about 100 years and suspects this was related to being in the the [employer] on an [employer].  He has uncorrected hearing loss and has not seen anyone about hearing aides.  He is planning to follow-up with eye doctor regarding weak prescription and wears pair with protective side bumpers currently.    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  He reports severe pain about 6 months after he retired for prolonged period but this has mostly  resolved.  Pt accompanied by: significant other (wife - [name]  - she primarily drives him)  PERTINENT HISTORY: DM2, angina, left plural effusion, CAD, HTN  Recent hospitalization 12/28-12/30 for fall w/ left rib fractures (initial fall happened in 08/2024):   Multiple left rib fractures, acute mildly displaced lateral 8th, acute lateral 9th, acute displaced 10th and healing subacute posterior 12th.  There is also a new subacute lateral 7th.  PAIN:  Are you having pain? No  PRECAUTIONS: Fall; hard of hearing  RED FLAGS: Bowel or bladder incontinence: Yes: urge incontinence and frequent fluctuation in urge   WEIGHT BEARING RESTRICTIONS: No  FALLS: Has patient fallen in last 6 months? Yes. Number of falls 5-6 per month over last 6 months  LIVING ENVIRONMENT: Lives with: lives with their family (wife and son and son's wife) Lives in: House/apartment Stairs: Yes: Internal: 10 + 5 steps; left rail coming up the 10 steps and right rail coming up the 5 steps and External: 3 (primary entrance is back door) steps; none Has following equipment at home: Single point cane, shower chair, and Grab bars  PLOF: Independent  PATIENT GOALS: to get stronger  OBJECTIVE:  Note: Objective measures were completed at Evaluation unless otherwise noted.  LUE BP in sitting prior to session: Vitals:   11/09/24 1321  BP: (!) 146/66  Pulse: 68   Blood glucose at onset of session:  147  DIAGNOSTIC FINDINGS:  Brain MRI 11/01/2024: IMPRESSION: 1. No acute intracranial abnormality or mass. 2. Cerebral atrophy and mild chronic small vessel ischemic disease.  Pt reports upcoming follow-up x-ray of broken ribs.  COGNITION: Overall cognitive status: Within functional limits for tasks assessed   SENSATION: Light touch: WFL  COORDINATION: LE RAMS:  WNL Heel-to-shin:  WNL bilaterally  EDEMA:  None noted in BLE  MUSCLE TONE: None noted in BLE  POSTURE: rounded shoulders and forward  head  LOWER EXTREMITY ROM:     Active  Right Eval Left Eval  Hip flexion Grossly WFL  Hip extension   Hip abduction   Hip adduction   Hip internal rotation   Hip external rotation   Knee flexion   Knee extension   Ankle dorsiflexion   Ankle plantarflexion    Ankle inversion    Ankle eversion     (Blank rows = not tested)  LOWER EXTREMITY MMT:    MMT Right Eval Left Eval  Hip flexion 4 4  Hip extension    Hip abduction 3+ 3+  Hip adduction    Hip internal rotation    Hip external rotation    Knee flexion 5 5  Knee extension 4+ 4+  Ankle dorsiflexion 4 4  Ankle plantarflexion    Ankle inversion    Ankle eversion    (Blank rows = not tested)  BED MOBILITY:  Findings: Sit to supine Complete Independence Supine to sit Complete Independence Rolling to Right Complete Independence Rolling to Left Complete Independence  TRANSFERS: Sit to stand: Complete Independence  Assistive device utilized: None     Stand to sit: Complete Independence  Assistive device utilized: None     Chair to chair: Complete Independence  Assistive device utilized: None       RAMP:  Not tested  CURB:  Not tested  STAIRS: Not tested GAIT: Findings: Gait Characteristics: step through pattern, decreased step length- Right, decreased step length- Left, decreased stride length, decreased hip/knee flexion- Right, decreased hip/knee flexion- Left, decreased trunk rotation, and wide BOS, Distance walked: various clinic distances, Assistive device utilized:None, Level of assistance: SBA, and Comments: Pt ambulates slowly with great caution, some intermittent high guard of UE.  FUNCTIONAL TESTS:  5 times sit to stand: 14.84 sec no UE support 6 minute walk test: TBA 10 meter walk test: 10.50 sec = 0.95 m/sec OR 3.14 ft/sec; pt is dyspneic for several minutes following brief ambulation but not in acute distress (SpO2 95% after) Functional gait assessment:  TBA  PATIENT SURVEYS:  ABC scale: The  Activities-Specific Balance Confidence (ABC) Scale 0% 10 20 30  40 50 60 70 80 90 100% No confidence<->completely confident  How confident are you that you will not lose your balance or become unsteady when you . . .   Date tested 11/09/2024  Walk around the house 80%  2. Walk up or down stairs 50%  3. Bend over and pick up a slipper from in front of a closet floor 60%  4. Reach for a small can off a shelf at eye level 80%  5. Stand on tip toes and reach for something above your head 60%  6. Stand on a chair and reach for something 30%  7. Sweep the floor 80%  8. Walk outside the house to a car parked in the driveway 90%  9. Get into or out of a car 70%  10. Walk across a parking lot to the mall 70%  11. Walk up or down a ramp 70%  12. Walk in a crowded mall where people rapidly walk past you 60%  13. Are bumped into by people as you walk through the mall 50%  14. Step onto or off of an escalator while you are holding onto the railing 80%  15. Step onto or off an escalator while holding onto parcels such that  you cannot hold onto the railing 50%  16. Walk outside on icy sidewalks 30%  Total: #/16 1010=63.125%                                                                                                                                 TREATMENT DATE: 11/09/2024    PATIENT EDUCATION: Education details: If fall results in hitting head please have head evaluated by physician immediately.  Please follow-up with primary physician regarding acute urge incontinence and fluctuation in urge.  Follow-up with audiologist about hearing loss correction and tinnitus.  Outcome interpretations and assessments to be used for goals set today.  PT POC.  He may find benefit in establishing with a neurologist and cardiologist to assess some factors discussed today that may not resolve w/ PT alone.  Continue to monitor blood glucose at home and limits for therapy.  Reminder of neurologist appt for 2/26 at  8am. Person educated: Patient Education method: Explanation, Demonstration, and Verbal cues Education comprehension: verbalized understanding and needs further education  HOME EXERCISE PROGRAM: To be established.  GOALS: Goals reviewed with patient? Yes  SHORT TERM GOALS: Target date: 12/09/2024  Pt will be independent and compliant with initial strength and balance HEP in order to maintain functional progress and improve mobility. Baseline:  To be established. Goal status: INITIAL  2.  Pt will decrease 5xSTS to </=12 seconds w/o UE support in order to demonstrate decreased risk for falls and improved functional bilateral LE strength and power. Baseline: 14.84 sec no UE support Goal status: INITIAL  3.  to be assessed w/ goal set as appropriate. Baseline: TBA Goal status: INITIAL  4.  Pt will demonstrate a gait speed of >/=3.34 feet/sec in order to decrease risk for falls. Baseline: 3.14 ft/sec Goal status: INITIAL  5.  FGA to be assessed w/ goal set as appropriate. Baseline: TBA Goal status: INITIAL  LONG TERM GOALS: Target date: 01/06/2025  Pt will be independent and compliant with advanced and finalized strength and balance HEP in order to maintain functional progress and improve mobility. Baseline: To be established. Goal status: INITIAL  2.  Pt will demonstrate a gait speed of >/=3.54 feet/sec in order to decrease risk for falls. Baseline: 3.14 ft/sec Goal status: INITIAL  3.  to be assessed w/ goal set as appropriate. Baseline: TBA Goal status: INITIAL  4.  FGA to be assessed w/ goal set as appropriate. Baseline: TBA Goal status: INITIAL  5.  Pt to improve ABC Scale score to >/=73.125% in order to demonstrate improved fall risk and confidence with dynamic mobility. Baseline: 63.125% Goal status: INITIAL  ASSESSMENT:  CLINICAL IMPRESSION: Patient is a 69 y.o. male who was seen today for physical therapy evaluation and treatment for frequent falls  and imbalance.  Pt has a significant PMH of DM2, angina, left plural effusion, CAD, and HTN.  Identified impairments include widened gait mechanics, repeated falls, proximal LE  weakness, and poor endurance.  Evaluation via the following assessment tools: 5xSTS, , and ABC Scale indicate elevated fall risk.  He would benefit from skilled PT to address impairments as noted and progress towards long term goals.  OBJECTIVE IMPAIRMENTS: Abnormal gait, decreased activity tolerance, decreased balance, decreased endurance, decreased knowledge of condition, decreased knowledge of use of DME, decreased strength, decreased safety awareness, improper body mechanics, and postural dysfunction.   ACTIVITY LIMITATIONS: bending, standing, squatting, stairs, and locomotion level  PARTICIPATION LIMITATIONS: driving, shopping, and community activity  PERSONAL FACTORS: Age, Fitness, Past/current experiences, Time since onset of injury/illness/exacerbation, Transportation, and 1-2 comorbidities: angina, HTN are also affecting patient's functional outcome.   REHAB POTENTIAL: Excellent  CLINICAL DECISION MAKING: Evolving/moderate complexity  EVALUATION COMPLEXITY: Moderate  PLAN:  PT FREQUENCY: 2x/week  PT DURATION: 8 weeks  PLANNED INTERVENTIONS: 97164- PT Re-evaluation, 97750- Physical Performance Testing, 97110-Therapeutic exercises, 97530- Therapeutic activity, [id number]- Neuromuscular re-education, 97535- Self Care, [id number]- Manual therapy, [phone]- Gait training, Patient/Family education, Balance training, Stair training, Vestibular training, and DME instructions  PLAN FOR NEXT SESSION: Floor recovery.  ASSESS and FGA - set goals.  AD?  Gait training.  Dynamic balance.   Monitor HR and SpO2.  Blood glucose prior to session.  Establish HEP for strength (hips and core) and balance.   Daved KATHEE Bull, PT, DPT 11/09/2024, 2:10 PM        "

## 2024-11-23 ENCOUNTER — Ambulatory Visit: Admitting: Physical Therapy

## 2024-11-23 ENCOUNTER — Encounter: Payer: Self-pay | Admitting: Physical Therapy

## 2024-11-23 VITALS — BP 133/60 | HR 80

## 2024-11-23 DIAGNOSIS — M6281 Muscle weakness (generalized): Secondary | ICD-10-CM

## 2024-11-23 DIAGNOSIS — R2689 Other abnormalities of gait and mobility: Secondary | ICD-10-CM

## 2024-11-23 DIAGNOSIS — R2681 Unsteadiness on feet: Secondary | ICD-10-CM

## 2024-11-23 DIAGNOSIS — R296 Repeated falls: Secondary | ICD-10-CM

## 2024-11-23 NOTE — Patient Instructions (Signed)
 Access Code: [email or web address] URL: [email or web address] Date: 11/23/2024 Prepared by: Daved Bull  Exercises - Sit to Stand with Arms Crossed  - 1 x daily - 7 x weekly - 3 sets - 6-8 reps - Heel Raises with Counter Support  - 1 x daily - 4 x weekly - 2 sets - 12 reps - Backward Walking with Counter Support  - 1 x daily - 4 x weekly - 3 sets - 10 reps - Side Stepping with Resistance at Thighs and Counter Support  - 1 x daily - 4 x weekly - 3 sets - 10 reps

## 2024-11-23 NOTE — Therapy (Signed)
 " OUTPATIENT PHYSICAL THERAPY NEURO TREATMENT   Patient Name: [name] MRN: [id number] DOB:04-02-1956, 69 y.o., male Today's Date: 11/23/2024   PCP: Ransom Other, MD REFERRING PROVIDER: Ransom Other, MD  END OF SESSION:  PT End of Session - 11/23/24 9193     Visit Number 2    Number of Visits 17   16 + eval   Date for Recertification  01/19/25   pushed out due to pending weather concerns and scheduling delay   Authorization Type [employer] Medicare    Progress Note Due on Visit 10    PT Start Time 0800    PT Stop Time 0844    PT Time Calculation (min) 44 min    Equipment Utilized During Treatment Gait belt    Activity Tolerance Patient tolerated treatment well    Behavior During Therapy WFL for tasks assessed/performed          Past Medical History:  Diagnosis Date   Asthma    Colon polyp    Diabetes mellitus without complication (HCC)    Elevated LFTs    Fatty liver    Hypertension    Hypogonadism in male    Obesity    Rhinitis    Past Surgical History:  Procedure Laterality Date   CARDIAC CATHETERIZATION     CAROTID STENT     CORONARY ANGIOPLASTY     IR THORACENTESIS RIGHT ASP PLEURAL SPACE W/IMG GUIDE  10/17/2024   UMBILICAL HERNIA REPAIR     Patient Active Problem List   Diagnosis Date Noted   Thyroid  nodule 10/17/2024   Dizziness 10/17/2024   Pleural effusion on left 10/16/2024   CAD S/P percutaneous coronary angioplasty 10/16/2024   Hyponatremia 10/16/2024   Multiple rib fractures 10/16/2024   Essential hypertension 10/16/2024   Angina pectoris 05/27/2023   Type 2 diabetes mellitus (HCC)    Body mass index (BMI) 36.0-36.9, adult 01/07/2023    ONSET DATE: 10/31/2024 (referral)  REFERRING DIAG: R26.9 (ICD-10-CM) - Unspecified abnormalities of gait and mobility  THERAPY DIAG:  Other abnormalities of gait and mobility  Muscle weakness (generalized)  Unsteadiness on feet  Repeated falls  Rationale for Evaluation and Treatment:  Rehabilitation  SUBJECTIVE:                                                                                                                                                                                             SUBJECTIVE STATEMENT: He put a new glucose sensor on this morning.  He is having no pain and has not fallen since prior to evaluation.  His current blood glucose is 205.  Pt accompanied by: significant other (wife - [name]  - she primarily drives him)  PERTINENT HISTORY: DM2, angina, left plural effusion, CAD, HTN  Recent hospitalization 12/28-12/30 for fall w/ left rib fractures (initial fall happened in 08/2024):   Multiple left rib fractures, acute mildly displaced lateral 8th, acute lateral 9th, acute displaced 10th and healing subacute posterior 12th.  There is also a new subacute lateral 7th.  PAIN:  Are you having pain? No  PRECAUTIONS: Fall; hard of hearing  RED FLAGS: Bowel or bladder incontinence: Yes: urge incontinence and frequent fluctuation in urge   WEIGHT BEARING RESTRICTIONS: No  FALLS: Has patient fallen in last 6 months? Yes. Number of falls 5-6 per month over last 6 months  LIVING ENVIRONMENT: Lives with: lives with their family (wife and son and son's wife) Lives in: House/apartment Stairs: Yes: Internal: 10 + 5 steps; left rail coming up the 10 steps and right rail coming up the 5 steps and External: 3 (primary entrance is back door) steps; none Has following equipment at home: Single point cane, shower chair, and Grab bars  PLOF: Independent  PATIENT GOALS: to get stronger  OBJECTIVE:  Note: Objective measures were completed at Evaluation unless otherwise noted.  LUE BP in sitting prior to session: Vitals:   11/23/24 0803  BP: 133/60  Pulse: 80  SpO2: 97%   Blood glucose at onset of session:  147  DIAGNOSTIC FINDINGS:  Brain MRI 11/01/2024: IMPRESSION: 1. No acute intracranial abnormality or mass. 2. Cerebral atrophy and  mild chronic small vessel ischemic disease.  Pt reports upcoming follow-up x-ray of broken ribs.  COGNITION: Overall cognitive status: Within functional limits for tasks assessed   SENSATION: Light touch: WFL  COORDINATION: LE RAMS:  WNL Heel-to-shin:  WNL bilaterally  EDEMA:  None noted in BLE  MUSCLE TONE: None noted in BLE  POSTURE: rounded shoulders and forward head  LOWER EXTREMITY ROM:     Active  Right Eval Left Eval  Hip flexion Grossly WFL  Hip extension   Hip abduction   Hip adduction   Hip internal rotation   Hip external rotation   Knee flexion   Knee extension   Ankle dorsiflexion   Ankle plantarflexion    Ankle inversion    Ankle eversion     (Blank rows = not tested)  LOWER EXTREMITY MMT:    MMT Right Eval Left Eval  Hip flexion 4 4  Hip extension    Hip abduction 3+ 3+  Hip adduction    Hip internal rotation    Hip external rotation    Knee flexion 5 5  Knee extension 4+ 4+  Ankle dorsiflexion 4 4  Ankle plantarflexion    Ankle inversion    Ankle eversion    (Blank rows = not tested)  BED MOBILITY:  Findings: Sit to supine Complete Independence Supine to sit Complete Independence Rolling to Right Complete Independence Rolling to Left Complete Independence  TRANSFERS: Sit to stand: Complete Independence  Assistive device utilized: None     Stand to sit: Complete Independence  Assistive device utilized: None     Chair to chair: Complete Independence  Assistive device utilized: None       RAMP:  Not tested  CURB:  Not tested  STAIRS: Not tested GAIT: Findings: Gait Characteristics: step through pattern, decreased step length- Right, decreased step length- Left, decreased stride length, decreased hip/knee flexion- Right, decreased hip/knee flexion- Left, decreased trunk rotation, and wide BOS, Distance walked: various clinic distances,  Assistive device utilized:None, Level of assistance: SBA, and Comments: Pt ambulates slowly  with great caution, some intermittent high guard of UE.  FUNCTIONAL TESTS:  5 times sit to stand: 14.84 sec no UE support 6 minute walk test: TBA 10 meter walk test: 10.50 sec = 0.95 m/sec OR 3.14 ft/sec; pt is dyspneic for several minutes following brief ambulation but not in acute distress (SpO2 95% after) Functional gait assessment:  TBA  PATIENT SURVEYS:  ABC scale: The Activities-Specific Balance Confidence (ABC) Scale 0% 10 20 30  40 50 60 70 80 90 100% No confidence<->completely confident  How confident are you that you will not lose your balance or become unsteady when you . . .   Date tested 11/09/2024  Walk around the house 80%  2. Walk up or down stairs 50%  3. Bend over and pick up a slipper from in front of a closet floor 60%  4. Reach for a small can off a shelf at eye level 80%  5. Stand on tip toes and reach for something above your head 60%  6. Stand on a chair and reach for something 30%  7. Sweep the floor 80%  8. Walk outside the house to a car parked in the driveway 90%  9. Get into or out of a car 70%  10. Walk across a parking lot to the mall 70%  11. Walk up or down a ramp 70%  12. Walk in a crowded mall where people rapidly walk past you 60%  13. Are bumped into by people as you walk through the mall 50%  14. Step onto or off of an escalator while you are holding onto the railing 80%  15. Step onto or off an escalator while holding onto parcels such that you cannot hold onto the railing 50%  16. Walk outside on icy sidewalks 30%  Total: #/16 1010=63.125%                                                                                                                                 TREATMENT DATE: 11/23/2024  LUE in sitting prior to interventions: Vitals:   11/23/24 0803  BP: 133/60  Pulse: 80  SpO2: 97%   :  729 ft CGA (standing recovery at 335 ft + 538 ft + 679 ft); SpO2 98% following FGA:  [facility] PT Assessment - 11/23/24 0817       Functional  Gait  Assessment   Gait assessed  Yes    Gait Level Surface Walks 20 ft, slow speed, abnormal gait pattern, evidence for imbalance or deviates 10-15 in outside of the 12 in walkway width. Requires more than 7 sec to ambulate 20 ft.    Change in Gait Speed Makes only minor adjustments to walking speed, or accomplishes a change in speed with significant gait deviations, deviates 10-15 in outside the 12 in walkway width, or changes speed but loses balance but is  able to recover and continue walking.    Gait with Horizontal Head Turns Performs head turns smoothly with slight change in gait velocity (eg, minor disruption to smooth gait path), deviates 6-10 in outside 12 in walkway width, or uses an assistive device.    Gait with Vertical Head Turns Performs task with moderate change in gait velocity, slows down, deviates 10-15 in outside 12 in walkway width but recovers, can continue to walk.   pitches forward w/ head down   Gait and Pivot Turn Pivot turns safely in greater than 3 sec and stops with no loss of balance, or pivot turns safely within 3 sec and stops with mild imbalance, requires small steps to catch balance.    Step Over Obstacle Is able to step over one shoe box (4.5 in total height) without changing gait speed. No evidence of imbalance.    Gait with Narrow Base of Support Ambulates 4-7 steps.   4 steps   Gait with Eyes Closed Cannot walk 20 ft without assistance, severe gait deviations or imbalance, deviates greater than 15 in outside 12 in walkway width or will not attempt task.    Ambulating Backwards Walks 20 ft, slow speed, abnormal gait pattern, evidence for imbalance, deviates 10-15 in outside 12 in walkway width.    Steps Alternating feet, must use rail.    Total Score 13    FGA comment: 13/30 = significant fall risk         -STS 2x6, cued to dec UE support and prevent back touching chair b/w reps -Heel raises at counter 2x12 -Backwards walking at counter 5x8 ft, cued to improve  posture and use of UE as guidance vs reliance -Lateral stepping w/ red band at thighs 3x8 ft each direction  SpO2 95% following activity, 98% following 2-3 minutes of rest at end of session.  PATIENT EDUCATION: Education details: Outcome interpretations and goals set.  Initial HEP. Person educated: Patient Education method: Explanation, Demonstration, and Verbal cues Education comprehension: verbalized understanding and needs further education  HOME EXERCISE PROGRAM: Access Code: [email or web address] URL: [email or web address] Date: 11/23/2024 Prepared by: Daved Bull  Exercises - Sit to Stand with Arms Crossed  - 1 x daily - 7 x weekly - 3 sets - 6-8 reps - Heel Raises with Counter Support  - 1 x daily - 4 x weekly - 2 sets - 12 reps - Backward Walking with Counter Support  - 1 x daily - 4 x weekly - 3 sets - 10 reps - Side Stepping with Resistance at Thighs and Counter Support  - 1 x daily - 4 x weekly - 3 sets - 10 reps  GOALS: Goals reviewed with patient? Yes  SHORT TERM GOALS: Target date: 12/09/2024  Pt will be independent and compliant with initial strength and balance HEP in order to maintain functional progress and improve mobility. Baseline:  To be established. Goal status: INITIAL  2.  Pt will decrease 5xSTS to </=12 seconds w/o UE support in order to demonstrate decreased risk for falls and improved functional bilateral LE strength and power. Baseline: 14.84 sec no UE support Goal status: INITIAL  3.  Pt will ambulate >/=829 feet on to demonstrate improved functional endurance for home and community participation. Baseline: 729 ft CGA 3 standing recovery (2/4) Goal status: INITIAL  4.  Pt will demonstrate a gait speed of >/=3.34 feet/sec in order to decrease risk for falls. Baseline: 3.14 ft/sec Goal status: INITIAL  5.  Pt will improve FGA score  to >/=17/30 in order to demonstrate improved balance and decreased fall risk. Baseline: 13/30  (2/4) Goal status: INITIAL  LONG TERM GOALS: Target date: 01/06/2025  Pt will be independent and compliant with advanced and finalized strength and balance HEP in order to maintain functional progress and improve mobility. Baseline: To be established. Goal status: INITIAL  2.  Pt will demonstrate a gait speed of >/=3.54 feet/sec in order to decrease risk for falls. Baseline: 3.14 ft/sec Goal status: INITIAL  3.  Pt will ambulate >/=929 feet on to demonstrate improved functional endurance for home and community participation. Baseline: 729 ft CGA 3 standing recovery (2/4) Goal status: INITIAL  4.  Pt will improve FGA score to >/=21/30 in order to demonstrate improved balance and decreased fall risk. Baseline: 13/30 (2/4) Goal status: INITIAL  5.  Pt to improve ABC Scale score to >/=73.125% in order to demonstrate improved fall risk and confidence with dynamic mobility. Baseline: 63.125% Goal status: INITIAL  ASSESSMENT:  CLINICAL IMPRESSION: Initial focus of skilled PT session today on capturing metrics not captured on evaluation.  He is unable to complete without standing recovery which was encouraged due to variation in gait mechanics.  He covers 729 ft total and scores 13/30 on FGA most impacted by narrowed BOS, backwards ambulation, and looking down.  He would benefit from further endurance work in addition to dynamic balance challenges to improve fall risk.  Continue per POC.  OBJECTIVE IMPAIRMENTS: Abnormal gait, decreased activity tolerance, decreased balance, decreased endurance, decreased knowledge of condition, decreased knowledge of use of DME, decreased strength, decreased safety awareness, improper body mechanics, and postural dysfunction.   ACTIVITY LIMITATIONS: bending, standing, squatting, stairs, and locomotion level  PARTICIPATION LIMITATIONS: driving, shopping, and community activity  PERSONAL FACTORS: Age, Fitness, Past/current experiences, Time since  onset of injury/illness/exacerbation, Transportation, and 1-2 comorbidities: angina, HTN are also affecting patient's functional outcome.   REHAB POTENTIAL: Excellent  CLINICAL DECISION MAKING: Evolving/moderate complexity  EVALUATION COMPLEXITY: Moderate  PLAN:  PT FREQUENCY: 2x/week  PT DURATION: 8 weeks  PLANNED INTERVENTIONS: 97164- PT Re-evaluation, 97750- Physical Performance Testing, 97110-Therapeutic exercises, 97530- Therapeutic activity, [id number]- Neuromuscular re-education, 97535- Self Care, [id number]- Manual therapy, [phone]- Gait training, Patient/Family education, Balance training, Stair training, Vestibular training, and DME instructions  PLAN FOR NEXT SESSION: Floor recovery.  AD?  Gait training.  Dynamic balance.   Monitor HR and SpO2.  Blood glucose prior to session.  Expand HEP for strength (hips and core) and balance - supine, counter birddog, monster walks, head nods (looking down).  Walking program?   Daved KATHEE Bull, PT, DPT 11/23/2024, 8:49 AM        "

## 2024-11-25 ENCOUNTER — Encounter: Payer: Self-pay | Admitting: Physical Therapy

## 2024-11-25 ENCOUNTER — Ambulatory Visit: Admitting: Physical Therapy

## 2024-11-25 DIAGNOSIS — R2681 Unsteadiness on feet: Secondary | ICD-10-CM

## 2024-11-25 DIAGNOSIS — R296 Repeated falls: Secondary | ICD-10-CM

## 2024-11-25 DIAGNOSIS — R2689 Other abnormalities of gait and mobility: Secondary | ICD-10-CM

## 2024-11-25 DIAGNOSIS — M6281 Muscle weakness (generalized): Secondary | ICD-10-CM

## 2024-11-25 NOTE — Progress Notes (Signed)
 [name]                                          MRN: [id number]   11/25/2024   The [facility] Quality Team Specialist reviewed this patient medical record for the purposes of chart review for care gap closure. The following were reviewed: abstraction for care gap closure-glycemic status assessment.    [facility] Quality Team

## 2024-11-25 NOTE — Therapy (Signed)
 " OUTPATIENT PHYSICAL THERAPY NEURO TREATMENT   Patient Name: [name] MRN: [id number] DOB:[birth year], 69 y.o., male Today's Date: 11/25/2024   PCP: Ransom Other, MD REFERRING PROVIDER: Ransom Other, MD  END OF SESSION:  PT End of Session - 11/25/24 0934     Visit Number 3    Number of Visits 17   16 + eval   Date for Recertification  01/19/25   pushed out due to pending weather concerns and scheduling delay   Authorization Type [employer] Medicare    Progress Note Due on Visit 10    PT Start Time 0930    PT Stop Time 1014    PT Time Calculation (min) 44 min    Equipment Utilized During Treatment Gait belt    Activity Tolerance Patient tolerated treatment well    Behavior During Therapy WFL for tasks assessed/performed          Past Medical History:  Diagnosis Date   Asthma    Colon polyp    Diabetes mellitus without complication (HCC)    Elevated LFTs    Fatty liver    Hypertension    Hypogonadism in male    Obesity    Rhinitis    Past Surgical History:  Procedure Laterality Date   CARDIAC CATHETERIZATION     CAROTID STENT     CORONARY ANGIOPLASTY     IR THORACENTESIS RIGHT ASP PLEURAL SPACE W/IMG GUIDE  10/17/2024   UMBILICAL HERNIA REPAIR     Patient Active Problem List   Diagnosis Date Noted   Thyroid  nodule 10/17/2024   Dizziness 10/17/2024   Pleural effusion on left 10/16/2024   CAD S/P percutaneous coronary angioplasty 10/16/2024   Hyponatremia 10/16/2024   Multiple rib fractures 10/16/2024   Essential hypertension 10/16/2024   Angina pectoris 05/27/2023   Type 2 diabetes mellitus (HCC)    Body mass index (BMI) 36.0-36.9, adult 01/07/2023    ONSET DATE: 10/31/2024 (referral)  REFERRING DIAG: R26.9 (ICD-10-CM) - Unspecified abnormalities of gait and mobility  THERAPY DIAG:  Other abnormalities of gait and mobility  Muscle weakness (generalized)  Unsteadiness on feet  Repeated falls  Rationale for Evaluation and Treatment:  Rehabilitation  SUBJECTIVE:                                                                                                                                                                                             SUBJECTIVE STATEMENT: He is having left knee pain over the last few days and attributes this to the cold weather.  He reports no falls or near falls.  His glucose is currently 270.  Pt accompanied by: significant other (wife - [name]  - she primarily drives him)  PERTINENT HISTORY: DM2, angina, left plural effusion, CAD, HTN  Recent hospitalization 12/28-12/30 for fall w/ left rib fractures (initial fall happened in 08/2024):   Multiple left rib fractures, acute mildly displaced lateral 8th, acute lateral 9th, acute displaced 10th and healing subacute posterior 12th.  There is also a new subacute lateral 7th.  PAIN:  Are you having pain? Yes: NPRS scale: 4 Pain location: left knee Pain description: dull ache (sharp fire is bending knee) Aggravating factors: bending knee Relieving factors: rest  PRECAUTIONS: Fall; hard of hearing  RED FLAGS: Bowel or bladder incontinence: Yes: urge incontinence and frequent fluctuation in urge   WEIGHT BEARING RESTRICTIONS: No  FALLS: Has patient fallen in last 6 months? Yes. Number of falls 5-6 per month over last 6 months  LIVING ENVIRONMENT: Lives with: lives with their family (wife and son and son's wife) Lives in: House/apartment Stairs: Yes: Internal: 10 + 5 steps; left rail coming up the 10 steps and right rail coming up the 5 steps and External: 3 (primary entrance is back door) steps; none Has following equipment at home: Single point cane, shower chair, and Grab bars  PLOF: Independent  PATIENT GOALS: to get stronger  OBJECTIVE:  Note: Objective measures were completed at Evaluation unless otherwise noted.  LUE BP in sitting prior to session: There were no vitals filed for this visit.  Blood glucose at onset  of session:  147  DIAGNOSTIC FINDINGS:  Brain MRI 11/01/2024: IMPRESSION: 1. No acute intracranial abnormality or mass. 2. Cerebral atrophy and mild chronic small vessel ischemic disease.  Pt reports upcoming follow-up x-ray of broken ribs.  COGNITION: Overall cognitive status: Within functional limits for tasks assessed   SENSATION: Light touch: WFL  COORDINATION: LE RAMS:  WNL Heel-to-shin:  WNL bilaterally  EDEMA:  None noted in BLE  MUSCLE TONE: None noted in BLE  POSTURE: rounded shoulders and forward head  LOWER EXTREMITY ROM:     Active  Right Eval Left Eval  Hip flexion Grossly WFL  Hip extension   Hip abduction   Hip adduction   Hip internal rotation   Hip external rotation   Knee flexion   Knee extension   Ankle dorsiflexion   Ankle plantarflexion    Ankle inversion    Ankle eversion     (Blank rows = not tested)  LOWER EXTREMITY MMT:    MMT Right Eval Left Eval  Hip flexion 4 4  Hip extension    Hip abduction 3+ 3+  Hip adduction    Hip internal rotation    Hip external rotation    Knee flexion 5 5  Knee extension 4+ 4+  Ankle dorsiflexion 4 4  Ankle plantarflexion    Ankle inversion    Ankle eversion    (Blank rows = not tested)  BED MOBILITY:  Findings: Sit to supine Complete Independence Supine to sit Complete Independence Rolling to Right Complete Independence Rolling to Left Complete Independence  TRANSFERS: Sit to stand: Complete Independence  Assistive device utilized: None     Stand to sit: Complete Independence  Assistive device utilized: None     Chair to chair: Complete Independence  Assistive device utilized: None       RAMP:  Not tested  CURB:  Not tested  STAIRS: Not tested GAIT: Findings: Gait Characteristics: step through pattern, decreased step length- Right, decreased step length- Left, decreased stride length, decreased hip/knee flexion-  Right, decreased hip/knee flexion- Left, decreased trunk rotation,  and wide BOS, Distance walked: various clinic distances, Assistive device utilized:None, Level of assistance: SBA, and Comments: Pt ambulates slowly with great caution, some intermittent high guard of UE.  FUNCTIONAL TESTS:  5 times sit to stand: 14.84 sec no UE support 6 minute walk test: TBA 10 meter walk test: 10.50 sec = 0.95 m/sec OR 3.14 ft/sec; pt is dyspneic for several minutes following brief ambulation but not in acute distress (SpO2 95% after) Functional gait assessment:  TBA  PATIENT SURVEYS:  ABC scale: The Activities-Specific Balance Confidence (ABC) Scale 0% 10 20 30  40 50 60 70 80 90 100% No confidence<->completely confident  How confident are you that you will not lose your balance or become unsteady when you . . .   Date tested 11/09/2024  Walk around the house 80%  2. Walk up or down stairs 50%  3. Bend over and pick up a slipper from in front of a closet floor 60%  4. Reach for a small can off a shelf at eye level 80%  5. Stand on tip toes and reach for something above your head 60%  6. Stand on a chair and reach for something 30%  7. Sweep the floor 80%  8. Walk outside the house to a car parked in the driveway 90%  9. Get into or out of a car 70%  10. Walk across a parking lot to the mall 70%  11. Walk up or down a ramp 70%  12. Walk in a crowded mall where people rapidly walk past you 60%  13. Are bumped into by people as you walk through the mall 50%  14. Step onto or off of an escalator while you are holding onto the railing 80%  15. Step onto or off an escalator while holding onto parcels such that you cannot hold onto the railing 50%  16. Walk outside on icy sidewalks 30%  Total: #/16 1010=63.125%                                                                                                                                 TREATMENT DATE: 11/25/2024  SpO2 98%, HR 84 bpm at onset of session.  Floor Recovery: Patient educated in floor recovery this visit  using teach-back for injury assessment and sequencing of task in clinic setting.  Discussion of transfer of skills to variable scenarios outside the clinic.  Patient has most difficulty with pushing to stand using LLE but demonstrates ease with RLE.  Performed 3 times. Caregiver Training:  Caregiver not present..   Level of Assist:  Verbal/tactile cues and SBA.    Alt Half kneel: -chest press 2x10  -Wood chops 4lb ball 2x10 SpO2 97%, HR 113 bpm following tasks.  -Countertop birddogs 2x20 -Countertop crunches 2x10 each side -SciFit x8 minutes at level 3.0 using BUE/BLE for large amplitude reciprocal mobility and endurance challenge.  Moderate cuing to maintain 9-10 inch stride.  RPE 5/10 at end of task.  SpO2 97%, HR 92 bpm following activity at end of session.  PATIENT EDUCATION: Education details: Floor recovery.  Continue HEP w/ additions today.  Diaphragmatic/pursed lip breathing.  Monitoring for signs of fatigue with activity for safety.  Start walking 2-3 minutes in home daily (he plans to transition to outdoor walks as weather warms up). Person educated: Patient Education method: Explanation, Demonstration, and Verbal cues Education comprehension: verbalized understanding and needs further education  HOME EXERCISE PROGRAM: Access Code: [email or web address] URL: [email or web address] Date: 11/23/2024 Prepared by: Daved Bull  Exercises - Sit to Stand with Arms Crossed  - 1 x daily - 7 x weekly - 3 sets - 6-8 reps - Heel Raises with Counter Support  - 1 x daily - 4 x weekly - 2 sets - 12 reps - Backward Walking with Counter Support  - 1 x daily - 4 x weekly - 3 sets - 10 reps - Side Stepping with Resistance at Thighs and Counter Support  - 1 x daily - 4 x weekly - 3 sets - 10 reps - Bird Dog on Counter  - 1 x daily - 4 x weekly - 2 sets - 10 reps  Start walking 2-3 minutes in home daily  GOALS: Goals reviewed with patient? Yes  SHORT TERM GOALS: Target date:  12/09/2024  Pt will be independent and compliant with initial strength and balance HEP in order to maintain functional progress and improve mobility. Baseline:  To be established. Goal status: INITIAL  2.  Pt will decrease 5xSTS to </=12 seconds w/o UE support in order to demonstrate decreased risk for falls and improved functional bilateral LE strength and power. Baseline: 14.84 sec no UE support Goal status: INITIAL  3.  Pt will ambulate >/=829 feet on to demonstrate improved functional endurance for home and community participation. Baseline: 729 ft CGA 3 standing recovery (2/4) Goal status: INITIAL  4.  Pt will demonstrate a gait speed of >/=3.34 feet/sec in order to decrease risk for falls. Baseline: 3.14 ft/sec Goal status: INITIAL  5.  Pt will improve FGA score to >/=17/30 in order to demonstrate improved balance and decreased fall risk. Baseline: 13/30 (2/4) Goal status: INITIAL  LONG TERM GOALS: Target date: 01/06/2025  Pt will be independent and compliant with advanced and finalized strength and balance HEP in order to maintain functional progress and improve mobility. Baseline: To be established. Goal status: INITIAL  2.  Pt will demonstrate a gait speed of >/=3.54 feet/sec in order to decrease risk for falls. Baseline: 3.14 ft/sec Goal status: INITIAL  3.  Pt will ambulate >/=929 feet on to demonstrate improved functional endurance for home and community participation. Baseline: 729 ft CGA 3 standing recovery (2/4) Goal status: INITIAL  4.  Pt will improve FGA score to >/=21/30 in order to demonstrate improved balance and decreased fall risk. Baseline: 13/30 (2/4) Goal status: INITIAL  5.  Pt to improve ABC Scale score to >/=73.125% in order to demonstrate improved fall risk and confidence with dynamic mobility. Baseline: 63.125% Goal status: INITIAL  ASSESSMENT:  CLINICAL IMPRESSION: Focus of skilled session today on progressing endurance challenges  with less seated recovery time and improving standing trunk engagement to balance tasks.  Made singular addition to HEP to support goals of session today and discussed him beginning daily pacing in home to work towards goal of returning to outdoors daily walks that he is unsure he  could tolerate safely currently.  He would further benefit from cardiovascular endurance challenges, dynamic balance, and core strengthening to decrease fall risk.  Continue per POC.  OBJECTIVE IMPAIRMENTS: Abnormal gait, decreased activity tolerance, decreased balance, decreased endurance, decreased knowledge of condition, decreased knowledge of use of DME, decreased strength, decreased safety awareness, improper body mechanics, and postural dysfunction.   ACTIVITY LIMITATIONS: bending, standing, squatting, stairs, and locomotion level  PARTICIPATION LIMITATIONS: driving, shopping, and community activity  PERSONAL FACTORS: Age, Fitness, Past/current experiences, Time since onset of injury/illness/exacerbation, Transportation, and 1-2 comorbidities: angina, HTN are also affecting patient's functional outcome.   REHAB POTENTIAL: Excellent  CLINICAL DECISION MAKING: Evolving/moderate complexity  EVALUATION COMPLEXITY: Moderate  PLAN:  PT FREQUENCY: 2x/week  PT DURATION: 8 weeks  PLANNED INTERVENTIONS: 97164- PT Re-evaluation, 97750- Physical Performance Testing, 97110-Therapeutic exercises, 97530- Therapeutic activity, [id number]- Neuromuscular re-education, 97535- Self Care, [id number]- Manual therapy, [id number]- Gait training, Patient/Family education, Balance training, Stair training, Vestibular training, and DME instructions  PLAN FOR NEXT SESSION: AD?  Gait training.  Dynamic balance.   Monitor HR and SpO2.  Blood glucose prior to session.  Expand HEP for strength (hips and core) and balance - supine, monster walks, head nods (looking down).  Stedy push/pull. Upgrade to formal walking program if 2-3 minutes daily is going  well!  SciFit >/= level 4.0   Daved KATHEE Bull, PT, DPT 11/25/2024, 10:17 AM        "

## 2024-11-25 NOTE — Patient Instructions (Signed)
-   Bird Dog on Counter  - 1 x daily - 4 x weekly - 2 sets - 10 reps  Start walking 2-3 minutes in home daily

## 2024-11-30 ENCOUNTER — Ambulatory Visit: Admitting: Physical Therapy

## 2024-12-02 ENCOUNTER — Ambulatory Visit: Admitting: Physical Therapy

## 2024-12-05 ENCOUNTER — Ambulatory Visit: Admitting: Physical Therapy

## 2024-12-07 ENCOUNTER — Ambulatory Visit: Admitting: Podiatry

## 2024-12-09 ENCOUNTER — Ambulatory Visit: Admitting: Physical Therapy

## 2024-12-14 ENCOUNTER — Ambulatory Visit: Admitting: Physical Therapy

## 2024-12-15 ENCOUNTER — Ambulatory Visit: Payer: Self-pay | Admitting: Neurology

## 2024-12-16 ENCOUNTER — Ambulatory Visit: Admitting: Physical Therapy

## 2024-12-21 ENCOUNTER — Ambulatory Visit: Admitting: Physical Therapy

## 2024-12-23 ENCOUNTER — Ambulatory Visit: Admitting: Physical Therapy

## 2024-12-28 ENCOUNTER — Ambulatory Visit: Admitting: Physical Therapy

## 2024-12-30 ENCOUNTER — Ambulatory Visit: Admitting: Physical Therapy

## 2025-01-02 ENCOUNTER — Ambulatory Visit: Admitting: Physical Therapy

## 2025-01-06 ENCOUNTER — Ambulatory Visit: Admitting: Physical Therapy
